# Patient Record
Sex: Female | Born: 1985 | Race: Black or African American | Hispanic: No | Marital: Married | Smoking: Never smoker
Health system: Southern US, Community
[De-identification: ages and names within clinical notes are randomized; demographics above are authoritative.]

## PROBLEM LIST (undated history)

## (undated) DIAGNOSIS — R87610 Atypical squamous cells of undetermined significance on cytologic smear of cervix (ASC-US): Secondary | ICD-10-CM

## (undated) DIAGNOSIS — R87810 Cervical high risk human papillomavirus (HPV) DNA test positive: Secondary | ICD-10-CM

## (undated) DIAGNOSIS — J189 Pneumonia, unspecified organism: Secondary | ICD-10-CM

## (undated) HISTORY — PX: TUBAL LIGATION: SHX77

## (undated) HISTORY — PX: APPENDECTOMY: SHX54

## (undated) HISTORY — DX: Atypical squamous cells of undetermined significance on cytologic smear of cervix (ASC-US): R87.610

## (undated) HISTORY — PX: COLPOSCOPY: SHX161

## (undated) HISTORY — DX: Cervical high risk human papillomavirus (HPV) DNA test positive: R87.810

---

## 2015-12-11 DIAGNOSIS — A749 Chlamydial infection, unspecified: Secondary | ICD-10-CM

## 2015-12-11 HISTORY — DX: Chlamydial infection, unspecified: A74.9

## 2021-01-11 ENCOUNTER — Telehealth: Payer: Self-pay

## 2021-01-11 NOTE — Telephone Encounter (Signed)
Pt called in to make her first prenatal visit. We made the pt her appt with Dr. Logan Bores. I asked the pt to please contact her last obgyn to have her records faxed over.  I gave the pt our fax number and the pt said that she will be contacting them.

## 2021-02-01 ENCOUNTER — Encounter: Payer: Self-pay | Admitting: Obstetrics and Gynecology

## 2021-02-01 ENCOUNTER — Ambulatory Visit (INDEPENDENT_AMBULATORY_CARE_PROVIDER_SITE_OTHER): Payer: Self-pay | Admitting: Obstetrics and Gynecology

## 2021-02-01 ENCOUNTER — Other Ambulatory Visit: Payer: Self-pay

## 2021-02-01 VITALS — BP 112/76 | HR 93 | Ht <= 58 in | Wt 190.3 lb

## 2021-02-01 DIAGNOSIS — Z7689 Persons encountering health services in other specified circumstances: Secondary | ICD-10-CM

## 2021-02-01 NOTE — Progress Notes (Signed)
HPI:      Ms. Brenda Dodson is a 35 y.o. [id number] who LMP was Patient's last menstrual period was 12/20/2020 (exact date).  Subjective:   She presents today to establish care for her pregnancy.  She recently moved to this area and plans to deliver her next child at [facility].  She reports that by last menstrual period dates she is approximately [redacted] weeks pregnant.  She has not had an ultrasound.  She is currently taking prenatal vitamins.  She has occasional nausea and vomiting but she is usually able to keep something down each day. She does state that she has been tired and rundown over the last few weeks. She has received the Covid vaccination. Previous pregnancies were uncomplicated vaginal births at term (2)    Hx: The following portions of the patient's history were reviewed and updated as appropriate:             She  has a past medical history of ASCUS with positive high risk HPV cervical and Chlamydia (2017). She does not have a problem list on file. She  has a past surgical history that includes Colposcopy and Appendectomy. Her family history includes Prostate cancer in her father. She  reports that she has never smoked. She does not have any smokeless tobacco history on file. She reports previous alcohol use. She reports that she does not use drugs. She has a current medication list which includes the following prescription(s): doxylamine (sleep), pnv prenatal plus multivitamin, and pyridoxine. She has No Known Allergies.       Review of Systems:  Review of Systems  Constitutional: Denied constitutional symptoms, night sweats, recent illness, fatigue, fever, insomnia and weight loss.  Eyes: Denied eye symptoms, eye pain, photophobia, vision change and visual disturbance.  Ears/Nose/Throat/Neck: Denied ear, nose, throat or neck symptoms, hearing loss, nasal discharge, sinus congestion and sore throat.  Cardiovascular: Denied cardiovascular symptoms, arrhythmia, chest pain/pressure, edema,  exercise intolerance, orthopnea and palpitations.  Respiratory: Denied pulmonary symptoms, asthma, pleuritic pain, productive sputum, cough, dyspnea and wheezing.  Gastrointestinal: Denied, gastro-esophageal reflux, melena, nausea and vomiting.  Genitourinary: Denied genitourinary symptoms including symptomatic vaginal discharge, pelvic relaxation issues, and urinary complaints.  Musculoskeletal: Denied musculoskeletal symptoms, stiffness, swelling, muscle weakness and myalgia.  Dermatologic: Denied dermatology symptoms, rash and scar.  Neurologic: Denied neurology symptoms, dizziness, headache, neck pain and syncope.  Psychiatric: Denied psychiatric symptoms, anxiety and depression.  Endocrine: Denied endocrine symptoms including hot flashes and night sweats.   Meds:   Current Outpatient Medications on File Prior to Visit  Medication Sig Dispense Refill  . doxylamine, Sleep, (UNISOM) 25 MG tablet Take 1 tablet at bedtime on days 1 and 2. If symptoms persist, take 1/2 tablet in the morning and 1 tablet at bedtime on day 3. If symptoms persist, take 1/2 tablet in the morning, 1/2 tablet in mid-afternoon, and 1 tablet at bedtime. Max 1 tablet three times daily.    . Prenatal Vit-Fe Fumarate-FA (PNV PRENATAL PLUS MULTIVITAMIN) 27-1 MG TABS Take 1 tablet by mouth daily.    Marland Kitchen pyridOXINE (VITAMIN B-6) 25 MG tablet Take 1 tablet at bedtime on days 1 and 2. If symptoms persist, take 1/2 tablet in the morning and 1 tablet at bedtime on day 3. If symptoms persist, take 1/2 tablet in the morning, 1/2 tablet in mid-afternoon, and 1 tablet at bedtime. Max 1 tablet three times daily.     No current facility-administered medications on file prior to visit.  Objective:     Vitals:   02/01/21 0923  BP: 112/76  Pulse: 93   Filed Weights   02/01/21 0923  Weight: 190 lb 4.8 oz (86.3 kg)                Assessment:    G3P2002 There are no problems to display for this patient.    1.  Encounter to establish care     Patient approximately 6 weeks by last menstrual period dates although she states her dating is not very accurate.   Plan:           Prenatal Plan 1.  The patient was given prenatal literature. 2.  She was continued on prenatal vitamins. 3.  A prenatal lab panel to be drawn at nurse visit. 4.  An ultrasound was ordered to better determine an EDC. 5.  A nurse visit was scheduled. 6.  Genetic testing and testing for other inheritable conditions discussed in detail. She will decide in the future whether to have these labs performed. 7.  A general overview of pregnancy testing, visit schedule, ultrasound schedule, and prenatal care was discussed. 8.  COVID and its risks associated with pregnancy, prevention by limiting exposure and use of masks, as well as the risks and benefits of vaccination during pregnancy were discussed in detail.  [facility] policy regarding office and hospital visitation and testing was explained. 9.  Benefits of breast-feeding discussed in detail including both maternal and infant benefits. Ready Set Baby website discussed. 10.  Nausea and vomiting during pregnancy discussed-literature given.   Orders No orders of the defined types were placed in this encounter.   No orders of the defined types were placed in this encounter.     F/U  Return in about 6 weeks (around 03/15/2021). I spent 33 minutes involved in the care of this patient preparing to see the patient by obtaining and reviewing her medical history (including labs, imaging tests and prior procedures), documenting clinical information in the electronic health record (EHR), counseling and coordinating care plans, writing and sending prescriptions, ordering tests or procedures and directly communicating with the patient by discussing pertinent items from her history and physical exam as well as detailing my assessment and plan as noted above so that she has an informed understanding.  All  of her questions were answered.  Elonda Husky, M.D. 02/01/2021 10:42 AM

## 2021-02-03 ENCOUNTER — Other Ambulatory Visit: Payer: Medicaid Other

## 2021-03-07 ENCOUNTER — Other Ambulatory Visit: Payer: Self-pay

## 2021-03-07 ENCOUNTER — Encounter: Payer: Self-pay | Admitting: Emergency Medicine

## 2021-03-07 ENCOUNTER — Emergency Department
Admission: EM | Admit: 2021-03-07 | Discharge: 2021-03-07 | Disposition: A | Discharge status: Home / Self Care | Payer: Medicaid Other | Attending: Emergency Medicine | Admitting: Emergency Medicine

## 2021-03-07 ENCOUNTER — Emergency Department: Payer: Medicaid Other

## 2021-03-07 DIAGNOSIS — Z3A15 15 weeks gestation of pregnancy: Secondary | ICD-10-CM | POA: Diagnosis not present

## 2021-03-07 DIAGNOSIS — O26852 Spotting complicating pregnancy, second trimester: Secondary | ICD-10-CM | POA: Insufficient documentation

## 2021-03-07 DIAGNOSIS — O209 Hemorrhage in early pregnancy, unspecified: Secondary | ICD-10-CM

## 2021-03-07 LAB — ABO/RH: ABO/RH(D): B POS

## 2021-03-07 LAB — CBC
HCT: 37.9 % (ref 36.0–46.0)
Hemoglobin: 12.3 g/dL (ref 12.0–15.0)
MCH: 27.1 pg (ref 26.0–34.0)
MCHC: 32.5 g/dL (ref 30.0–36.0)
MCV: 83.5 fL (ref 80.0–100.0)
Platelets: 249 10*3/uL (ref 150–400)
RBC: 4.54 MIL/uL (ref 3.87–5.11)
RDW: 15.4 % (ref 11.5–15.5)
WBC: 9.8 10*3/uL (ref 4.0–10.5)
nRBC: 0 % (ref 0.0–0.2)

## 2021-03-07 LAB — URINALYSIS, ROUTINE W REFLEX MICROSCOPIC
Bacteria, UA: NONE SEEN
Bilirubin Urine: NEGATIVE
Glucose, UA: NEGATIVE mg/dL
Ketones, ur: NEGATIVE mg/dL
Leukocytes,Ua: NEGATIVE
Nitrite: NEGATIVE
Protein, ur: NEGATIVE mg/dL
Specific Gravity, Urine: 1.017 (ref 1.005–1.030)
pH: 6 (ref 5.0–8.0)

## 2021-03-07 LAB — BASIC METABOLIC PANEL
Anion gap: 9 (ref 5–15)
BUN: 13 mg/dL (ref 6–20)
CO2: 19 mmol/L — ABNORMAL LOW (ref 22–32)
Calcium: 8.6 mg/dL — ABNORMAL LOW (ref 8.9–10.3)
Chloride: 104 mmol/L (ref 98–111)
Creatinine, Ser: 0.6 mg/dL (ref 0.44–1.00)
GFR, Estimated: >60 mL/min (ref >60)
Glucose, Bld: 85 mg/dL (ref 70–99)
Potassium: 3.4 mmol/L — ABNORMAL LOW (ref 3.5–5.1)
Sodium: 132 mmol/L — ABNORMAL LOW (ref 135–145)

## 2021-03-07 LAB — WET PREP, GENITAL
Clue Cells Wet Prep HPF POC: NONE SEEN
Sperm: NONE SEEN
Trich, Wet Prep: NONE SEEN
WBC, Wet Prep HPF POC: NONE SEEN
Yeast Wet Prep HPF POC: NONE SEEN

## 2021-03-07 LAB — HCG, QUANTITATIVE, PREGNANCY: hCG, Beta Chain, Quant, S: 71428 m[IU]/mL — ABNORMAL HIGH (ref <5)

## 2021-03-07 LAB — POC URINE PREG, ED: Preg Test, Ur: POSITIVE — AB

## 2021-03-07 NOTE — ED Notes (Signed)
See triage note   Presents with some vaginal bleeding   States she is approx 2 months pregnant   Developed heavy bleed this am with small clots

## 2021-03-07 NOTE — ED Provider Notes (Signed)
[facility] Emergency Department Provider Note ____________________________________________  Time seen: 1020  I have reviewed the triage vital signs and the nursing notes.  HISTORY  Chief Complaint  Vaginal Bleeding  HPI Brenda Dodson is a 35 y.o. female G2P0, presents to the ED noting she woke up this morning with spotting.  She reports her last menstrual period was 12/20/2020.  Denies any other complaints at this time denies any significant abdominal or pelvic pain.  She is now reporting some dark red blood in nature.  She denies any dysuria, hematuria or fevers.  History reviewed. No pertinent past medical history.  There are no problems to display for this patient.   Past Surgical History:  Procedure Laterality Date  . APPENDECTOMY      Prior to Admission medications   Not on File    Allergies Patient has no known allergies.  History reviewed. No pertinent family history.  Social History Social History   Tobacco Use  . Smoking status: Never Smoker  . Smokeless tobacco: Never Used  Substance Use Topics  . Alcohol use: Not Currently  . Drug use: Not Currently    Review of Systems  Constitutional: Negative for fever. Cardiovascular: Negative for chest pain. Respiratory: Negative for shortness of breath. Gastrointestinal: Negative for abdominal pain, vomiting and diarrhea. Genitourinary: Negative for dysuria.  Vaginal bleeding as above. Musculoskeletal: Negative for back pain. Skin: Negative for rash. Neurological: Negative for headaches, focal weakness or numbness. ____________________________________________  PHYSICAL EXAM:  VITAL SIGNS: ED Triage Vitals  Enc Vitals Group     BP 03/07/21 0859 124/77     Pulse Rate 03/07/21 0859 82     Resp 03/07/21 0859 20     Temp 03/07/21 0859 98.2 F (36.8 C)     Temp Source 03/07/21 0859 Oral     SpO2 03/07/21 0859 100 %     Weight 03/07/21 0858 195 lb (88.5 kg)     Height 03/07/21 0858  4\' 9"  (1.448 m)     Head Circumference --      Peak Flow --      Pain Score 03/07/21 0858 0     Pain Loc --      Pain Edu? --      Excl. in GC? --     Constitutional: Alert and oriented. Well appearing and in no distress. Head: Normocephalic and atraumatic. Eyes: Conjunctivae are normal. Normal extraocular movements Cardiovascular: Normal rate, regular rhythm. Normal distal pulses. Respiratory: Normal respiratory effort. No wheezes/rales/rhonchi. Gastrointestinal: Soft and nontender. No distention. GU: Normal external genitalia.  Cervical os is closed and there is some dark blood in the vault small clots noted.  No cervical motion tenderness is appreciated adnexal masses are noted. Musculoskeletal: Nontender with normal range of motion in all extremities.  Neurologic:  Normal gait without ataxia. Normal speech and language. No gross focal neurologic deficits are appreciated. ____________________________________________   LABS (pertinent positives/negatives) Labs Reviewed  BASIC METABOLIC PANEL - Abnormal; Notable for the following components:      Result Value   Sodium 132 (*)    Potassium 3.4 (*)    CO2 19 (*)    Calcium 8.6 (*)    All other components within normal limits  HCG, QUANTITATIVE, PREGNANCY - Abnormal; Notable for the following components:   hCG, Beta Chain, Quant, S 71,428 (*)    All other components within normal limits  URINALYSIS, ROUTINE W REFLEX MICROSCOPIC - Abnormal; Notable for the following components:   Color, Urine  YELLOW (*)    APPearance CLEAR (*)    Hgb urine dipstick LARGE (*)    All other components within normal limits  POC URINE PREG, ED - Abnormal; Notable for the following components:   Preg Test, Ur POSITIVE (*)    All other components within normal limits  WET PREP, GENITAL  CBC  ABO/RH  ____________________________________________   RADIOLOGY  US OB Limited  IMPRESSION: 1. Single live intrauterine pregnancy as detailed above. 2.  Subchorionic hemorrhage measuring 2.5 x 0.8 x 2.2 cm. Attention on follow-up examination is recommended.  This exam is performed on an emergent basis and does not comprehensively evaluate fetal size, dating, or anatomy; follow-up complete OB US should be considered if further fetal assessment is warranted. ____________________________________________  PROCEDURES  Procedures ____________________________________________  INITIAL IMPRESSION / ASSESSMENT AND PLAN / ED COURSE  ----------------------------------------- 10:30 AM on 03/07/2021 ----------------------------------------- S/w Lupita Leash ([place]) she confirms that patient is >[redacted]weeks GA and will adjust [place] order accordingly.    Differential diagnosis includes, but is not limited to, threatened miscarriage, incomplete miscarriage, normal bleeding from an early trimester pregnancy, ectopic pregnancy, blighted ovum, vaginal/cervical trauma, subchorionic hemorrhage/hematoma, etc.  Female patient confirmed at home urine pregnancy test presents to the ED with 1 day of spotting and bleeding. She was evaluated for her complaints, labs including urine and blood were normal at this time.  Patient ultrasound revealed a single IUP and a small subchorionic hemorrhage.  Patient was reassured overall by her findings, and will follow-up with her OB provider as discussed.  Return precautions have been discussed.   [name] was evaluated in Emergency Department on 03/07/2021 for the symptoms described in the history of present illness. She was evaluated in the context of the global COVID-19 pandemic, which necessitated consideration that the patient might be at risk for infection with the SARS-CoV-2 virus that causes COVID-19. Institutional protocols and algorithms that pertain to the evaluation of patients at risk for COVID-19 are in a state of rapid change based on information released by regulatory bodies including the CDC and federal and state  organizations. These policies and algorithms were followed during the patient's care in the ED. ____________________________________________  FINAL CLINICAL IMPRESSION(S) / ED DIAGNOSES  Final diagnoses:  Vaginal bleeding affecting early pregnancy      Lissa Hoard, PA-C 03/07/21 1918    Minna Antis, MD 03/08/21 1455

## 2021-03-07 NOTE — ED Triage Notes (Signed)
Pt to ED via POV, states is a G3P2L2, states woke up this morning with spotting, states LMP 1/11, unsure how far along she is.

## 2021-03-07 NOTE — Discharge Instructions (Signed)
Your exam, labs, ultrasound are all normal and reassuring at this time.  You do have a small subchorionic hemorrhage.  This is usually self-limited and and typically will resolve.  You will follow-up with your selected OB/GYN provider, for routine follow-up and repeat ultrasound.  Return to the ED if needed.

## 2021-03-08 ENCOUNTER — Encounter: Payer: Self-pay | Admitting: Obstetrics and Gynecology

## 2021-03-15 ENCOUNTER — Telehealth: Payer: Self-pay

## 2021-03-15 NOTE — Telephone Encounter (Signed)
Pt called after hour nurse 03/14/21 7:03pm; states she is 15-16 wks preg; is having bilateral lower abdominal and vaginal cramping; 5/10 on pain scale intermittent; no other sxs.  After hour nurse adv she be examined in the next 24hrs. Pt states the cramping is better today. Adv cramping is normal through out the whole preg as long as it doesn't double her over or stop her in her tracks.  Pt was concerned about fainting.  Admits she probably missed a meal or wasn't hydrated enough.  Adv to be sure to eat esp protein and to stay hydrated.

## 2021-03-21 ENCOUNTER — Ambulatory Visit (INDEPENDENT_AMBULATORY_CARE_PROVIDER_SITE_OTHER): Payer: PRIVATE HEALTH INSURANCE | Admitting: Obstetrics

## 2021-03-21 ENCOUNTER — Other Ambulatory Visit: Payer: Self-pay

## 2021-03-21 ENCOUNTER — Other Ambulatory Visit (HOSPITAL_COMMUNITY)
Admission: RE | Admit: 2021-03-21 | Discharge: 2021-03-21 | Disposition: A | Discharge status: Home / Self Care | Payer: Medicaid Other | Source: Ambulatory Visit | Attending: Obstetrics | Admitting: Obstetrics

## 2021-03-21 ENCOUNTER — Encounter: Payer: Self-pay | Admitting: Obstetrics

## 2021-03-21 VITALS — BP 120/80 | Wt 192.0 lb

## 2021-03-21 DIAGNOSIS — Z124 Encounter for screening for malignant neoplasm of cervix: Secondary | ICD-10-CM

## 2021-03-21 DIAGNOSIS — Z3480 Encounter for supervision of other normal pregnancy, unspecified trimester: Secondary | ICD-10-CM | POA: Insufficient documentation

## 2021-03-21 DIAGNOSIS — O0992 Supervision of high risk pregnancy, unspecified, second trimester: Secondary | ICD-10-CM

## 2021-03-21 DIAGNOSIS — O0990 Supervision of high risk pregnancy, unspecified, unspecified trimester: Secondary | ICD-10-CM | POA: Insufficient documentation

## 2021-03-21 DIAGNOSIS — Z3A17 17 weeks gestation of pregnancy: Secondary | ICD-10-CM

## 2021-03-21 NOTE — Progress Notes (Signed)
New Obstetric Patient H&P    Chief Complaint: "Desires prenatal care"   History of Present Illness: Patient is a 35 y.o. [id number] Not Hispanic or Latino female, LMP 12/20/2020 presents with amenorrhea and positive home pregnancy test. Based on her  LMP, her EDD is Estimated Date of Delivery: 08/25/21 and her EGA is [redacted]w[redacted]d. Cycles are 5. days, regular, and occur approximately every : 28 days. Her last pap smear was about 1 years ago and was unknown reslts as this was done at the [facility].    She had a urine pregnancy test which was positive about 3 month(s)  ago. She has not yet had labwork or a formal dating scan, but instead had one visit at  [facility], and a walk in visi to the [facility] in January when her pregnancy was confirmed.Her last menstrual period was normal and lasted for  approximately 5 day(s). Since her LMP she claims she has experienced abdominal pain and one episode of vaginal bleeding that was identified as a subchorionic hemorrhage at the hospital.. She denies vaginal bleedingtoday.  Her past medical history is noncontributory. Her prior pregnancies are notable for none  Since her LMP, she admits to the use of tobacco products  no She claims she has gained   an unknown amount pounds since the start of her pregnancy.  There are cats in the home in the home  no  She admits close contact with children on a regular basis  yes  She has had chicken pox in the past unknown She has had Tuberculosis exposures, symptoms, or previously tested positive for TB   no Current or past history of domestic violence. no  Genetic Screening/Teratology Counseling: (Includes patient, baby's father, or anyone in either family with:)   1. Patient's age >/= 56 at [facility]  no 2. Thalassemia ([place], [place], Mediterranean, or Asian background): MCV<80  no 3. Neural tube defect (meningomyelocele, spina bifida, anencephaly)  no 4. Congenital heart defect  no  5. Down syndrome   no 6. Tay-Sachs (Jewish, [place]  no 7. Canavan's Disease  no 8. Sickle cell disease or trait (African)  no  9. Hemophilia or other blood disorders  no  10. Muscular dystrophy  no  11. Cystic fibrosis  no  12. Huntington's Chorea  no  13. Mental retardation/autism  no 14. Other inherited genetic or chromosomal disorder  no 15. Maternal metabolic disorder (DM, PKU, etc)  no 16. Patient or FOB with a child with a birth defect not listed above no  16a. Patient or FOB with a birth defect themselves no 17. Recurrent pregnancy loss, or stillbirth  no  18. Any medications since LMP other than prenatal vitamins (include vitamins, supplements, OTC meds, drugs, alcohol)  no 19. Any other genetic/environmental exposure to discuss  no  Infection History:   1. Lives with someone with TB or TB exposed  no  2. Patient or partner has history of genital herpes  no 3. Rash or viral illness since LMP  no 4. History of STI (GC, CT, HPV, syphilis, HIV)  no 5. History of recent travel :  no  Other pertinent information:  no     Review of Systems:10 point review of systems negative unless otherwise noted in HPI  Past Medical History:  Past Medical History:  Diagnosis Date  . ASCUS with positive high risk HPV cervical   . Chlamydia 2017    Past Surgical History:  Past Surgical History:  Procedure Laterality Date  . APPENDECTOMY    . COLPOSCOPY      Gynecologic History: Patient's last menstrual period was 12/20/2020.  Obstetric History: [id number]  Family History:  Family History  Problem Relation Age of Onset  . Prostate cancer Father   . Breast cancer Neg Hx   . Ovarian cancer Neg Hx   . Colon cancer Neg Hx   . Diabetes Neg Hx     Social History:  Social History   Socioeconomic History  . Marital status: Married    Spouse name: Not on file  . Number of children: Not on file  . Years of education: Not on file  . Highest education level: Not on file  Occupational  History  . Not on file  Tobacco Use  . Smoking status: Never Smoker  . Smokeless tobacco: Never Used  Vaping Use  . Vaping Use: Never used  Substance and Sexual Activity  . Alcohol use: Not Currently  . Drug use: Not Currently  . Sexual activity: Yes    Birth control/protection: None  Other Topics Concern  . Not on file  Social History Narrative   ** Merged History Encounter **       Social Determinants of Health   Financial Resource Strain: Not on file  Food Insecurity: Not on file  Transportation Needs: Not on file  Physical Activity: Not on file  Stress: Not on file  Social Connections: Not on file  Intimate Partner Violence: Not on file    Allergies:  No Known Allergies  Medications: Prior to Admission medications   Medication Sig Start Date End Date Taking? Authorizing Provider  Prenatal Vit-Fe Fumarate-FA (PNV PRENATAL PLUS MULTIVITAMIN) 27-1 MG TABS Take 1 tablet by mouth daily. 01/04/21 01/04/22 Yes [provider]  doxylamine, Sleep, (UNISOM) 25 MG tablet Take 1 tablet at bedtime on days 1 and 2. If symptoms persist, take 1/2 tablet in the morning and 1 tablet at bedtime on day 3. If symptoms persist, take 1/2 tablet in the morning, 1/2 tablet in mid-afternoon, and 1 tablet at bedtime. Max 1 tablet three times daily. 01/04/21 02/03/21  [provider]  pyridOXINE (VITAMIN B-6) 25 MG tablet Take 1 tablet at bedtime on days 1 and 2. If symptoms persist, take 1/2 tablet in the morning and 1 tablet at bedtime on day 3. If symptoms persist, take 1/2 tablet in the morning, 1/2 tablet in mid-afternoon, and 1 tablet at bedtime. Max 1 tablet three times daily. Patient not taking: Reported on 03/21/2021 01/04/21 01/04/22  [provider]    Physical Exam Vitals: Blood pressure 120/80, weight 192 lb (87.1 kg), last menstrual period 12/20/2020.  General: NAD. Hihg BMI 41. HEENT: normocephalic, anicteric Thyroid: no enlargement, no palpable  nodules Pulmonary: No increased work of breathing, CTAB Cardiovascular: RRR, distal pulses 2+ Abdomen: NABS, soft, non-tender, non-distended.  Umbilicus without lesions.  No hepatomegaly, splenomegaly or masses palpable. No evidence of hernia  Genitourinary:  External: Normal external female genitalia.  Normal urethral meatus, normal  Bartholin's and Skene's glands.    Vagina: Normal vaginal mucosa, no evidence of prolapse.    Cervix: Grossly normal in appearance, no bleeding  Uterus: enlarged , mobile, normal contour.  No CMT  +FHTS  Adnexa: ovaries non-enlarged, no adnexal masses  Rectal: deferred Extremities: no edema, erythema, or tenderness Neurologic: Grossly intact Psychiatric: mood appropriate, affect full   Assessment: 35 y.o. G3P2002 at [redacted]w[redacted]d presenting to initiate prenatal care  Plan: 1) Avoid alcoholic beverages. 2)  Patient encouraged not to smoke.  3) Discontinue the use of all non-medicinal drugs and chemicals.  4) Take prenatal vitamins daily.  5) Nutrition, food safety (fish, cheese advisories, and high nitrite foods) and exercise discussed. 6) Hospital and practice style discussed with cross coverage system.  7) Genetic Screening, such as with 1st Trimester Screening, cell free fetal DNA, AFP testing, and Ultrasound, as well as with amniocentesis and CVS as appropriate, is discussed with patient. At the conclusion of today's visit patient declined genetic testing 8) Patient is asked about travel to areas at risk for the [place] virus, and counseled to avoid travel and exposure to mosquitoes or sexual partners who may have themselves been exposed to the virus. Testing is discussed, and will be ordered as appropriate.  She is late to care. Advised we would draw labs at her next visit in 2 weeks and add a 1hr GTT at that visit. Ultrasound ordered with the [facility] due to late to care, high BMI and her report of a subchorionic hemorrhage earlier in the pregnancy. Encouraged to gain  less than 20 lbs during the pregnancy.   Mirna Mires, CNM  03/21/2021 1:01 PM

## 2021-03-21 NOTE — Progress Notes (Signed)
NOB- lower abdominal pain daily since end of March, fainted once at work 4/1

## 2021-03-22 ENCOUNTER — Other Ambulatory Visit: Payer: Self-pay | Admitting: Obstetrics

## 2021-03-22 DIAGNOSIS — O418X20 Other specified disorders of amniotic fluid and membranes, second trimester, not applicable or unspecified: Secondary | ICD-10-CM

## 2021-03-23 LAB — URINE CULTURE

## 2021-03-24 LAB — CYTOLOGY - PAP
Chlamydia: NEGATIVE
Comment: NEGATIVE
Comment: NEGATIVE
Comment: NORMAL
Diagnosis: NEGATIVE
Neisseria Gonorrhea: NEGATIVE
Trichomonas: NEGATIVE

## 2021-03-28 ENCOUNTER — Ambulatory Visit (INDEPENDENT_AMBULATORY_CARE_PROVIDER_SITE_OTHER): Payer: PRIVATE HEALTH INSURANCE | Admitting: Obstetrics

## 2021-03-28 ENCOUNTER — Other Ambulatory Visit: Payer: Self-pay

## 2021-03-28 ENCOUNTER — Other Ambulatory Visit: Payer: PRIVATE HEALTH INSURANCE

## 2021-03-28 VITALS — BP 100/70 | Wt 192.0 lb

## 2021-03-28 DIAGNOSIS — Z3A17 17 weeks gestation of pregnancy: Secondary | ICD-10-CM

## 2021-03-28 DIAGNOSIS — Z1379 Encounter for other screening for genetic and chromosomal anomalies: Secondary | ICD-10-CM

## 2021-03-28 DIAGNOSIS — Z3480 Encounter for supervision of other normal pregnancy, unspecified trimester: Secondary | ICD-10-CM

## 2021-03-28 DIAGNOSIS — O0990 Supervision of high risk pregnancy, unspecified, unspecified trimester: Secondary | ICD-10-CM

## 2021-03-28 DIAGNOSIS — Z3A18 18 weeks gestation of pregnancy: Secondary | ICD-10-CM

## 2021-03-28 LAB — POCT URINALYSIS DIPSTICK OB
Glucose, UA: NEGATIVE
POC,PROTEIN,UA: NEGATIVE

## 2021-03-28 NOTE — Addendum Note (Signed)
Addended by: Mirna Mires on: 03/28/2021 11:41 AM   Modules accepted: Orders

## 2021-03-28 NOTE — Addendum Note (Signed)
Addended by: Cornelius Moras D on: 03/28/2021 11:12 AM   Modules accepted: Orders

## 2021-03-28 NOTE — Progress Notes (Signed)
Routine Prenatal Care Visit  Subjective  Brenda Dodson is a 35 y.o. G3P2002 at [redacted]w[redacted]d being seen today for ongoing prenatal care.  She is currently monitored for the following issues for this high-risk pregnancy and has Supervision of high risk pregnancy, antepartum on their problem list.  ----------------------------------------------------------------------------------- Patient reports no complaints.  She is having labs drawn today, including the early 1hr GTT Contractions: Not present. Vag. Bleeding: None.   . Leaking Fluid denies.  ----------------------------------------------------------------------------------- The following portions of the patient's history were reviewed and updated as appropriate: allergies, current medications, past family history, past medical history, past social history, past surgical history and problem list. Problem list updated.  Objective  Blood pressure 100/70, weight 192 lb (87.1 kg), last menstrual period 12/20/2020. Pregravid weight 170 lb (77.1 kg) Total Weight Gain 22 lb (9.979 kg) Urinalysis: Urine Protein    Urine Glucose    Fetal Status:           General:  Alert, oriented and cooperative. Patient is in no acute distress.  Skin: Skin is warm and dry. No rash noted.   Cardiovascular: Normal heart rate noted  Respiratory: Normal respiratory effort, no problems with respiration noted  Abdomen: Soft, gravid, appropriate for gestational age. Pain/Pressure: Absent     Pelvic:  Cervical exam deferred        Extremities: Normal range of motion.     Mental Status: Normal mood and affect. Normal behavior. Normal judgment and thought content.   Assessment   35 y.o. G3P2002 at 104w4d by  08/25/2021, by Ultrasound presenting for routine prenatal visit  Plan   THIRD Problems (from 03/21/21 to present)    Problem Noted Resolved   Supervision of high risk pregnancy, antepartum 03/21/2021 by Mirna Mires, CNM No   Overview Addendum 03/28/2021 11:01 AM by  Mirna Mires, CNM     Clinic  Prenatal Labs  Dating  Blood type: --/--/B POS Performed at [facility], [address] [place], [place] [address]  (03/29 6948)   Genetic Screen 1 Screen:    AFP:     Quad:     NIPS: Antibody:   Anatomic [place]  Rubella:    GTT Early:      4/19         Third trimester:  RPR:     Flu vaccine  HBsAg:     TDaP vaccine                                               Rhogam: HIV:     Baby Food                                               GBS: (For PCN allergy, check sensitivities)  Contraception  NIO:EVOJ  Circumcision    Pediatrician    Support Person     High BMI Late to care Subchorionic bleed         Previous Version       Preterm labor symptoms and general obstetric precautions including but not limited to vaginal bleeding, contractions, leaking of fluid and fetal movement were reviewed in detail with the patient. Please refer to After Visit Summary for other counseling recommendations.   Return in  about 4 weeks (around 04/25/2021) for return OB.  Her anatomy scan has been scheduled.  Mirna Mires, CNM  03/28/2021 11:01 AM

## 2021-03-29 LAB — RPR+RH+ABO+RUB AB+AB SCR+CB...
Antibody Screen: NEGATIVE
HIV Screen 4th Generation wRfx: NONREACTIVE
Hematocrit: 35.9 % (ref 34.0–46.6)
Hemoglobin: 11.4 g/dL (ref 11.1–15.9)
Hepatitis B Surface Ag: NEGATIVE
MCH: 27 pg (ref 26.6–33.0)
MCHC: 31.8 g/dL (ref 31.5–35.7)
MCV: 85 fL (ref 79–97)
Platelets: 225 10*3/uL (ref 150–450)
RBC: 4.22 x10E6/uL (ref 3.77–5.28)
RDW: 14.2 % (ref 11.7–15.4)
RPR Ser Ql: NONREACTIVE
Rh Factor: POSITIVE
Rubella Antibodies, IGG: 21.5 index (ref >0.99)
Varicella zoster IgG: >4000 index (ref >165)
WBC: 8.4 10*3/uL (ref 3.4–10.8)

## 2021-03-29 LAB — GLUCOSE TOLERANCE, 1 HOUR: Glucose, 1Hr PP: 125 mg/dL (ref 65–199)

## 2021-04-02 LAB — MATERNIT 21 PLUS CORE, BLOOD
Fetal Fraction: 10
Result (T21): NEGATIVE
Trisomy 13 (Patau syndrome): NEGATIVE
Trisomy 18 (Edwards syndrome): NEGATIVE
Trisomy 21 (Down syndrome): NEGATIVE

## 2021-04-20 ENCOUNTER — Other Ambulatory Visit: Payer: Self-pay

## 2021-04-20 ENCOUNTER — Ambulatory Visit: Payer: Medicaid Other | Attending: Obstetrics

## 2021-04-20 ENCOUNTER — Ambulatory Visit: Payer: Medicaid Other

## 2021-04-20 ENCOUNTER — Other Ambulatory Visit: Payer: Self-pay | Admitting: Obstetrics

## 2021-04-20 DIAGNOSIS — O99212 Obesity complicating pregnancy, second trimester: Secondary | ICD-10-CM | POA: Diagnosis not present

## 2021-04-20 DIAGNOSIS — E669 Obesity, unspecified: Secondary | ICD-10-CM | POA: Diagnosis not present

## 2021-04-20 DIAGNOSIS — O418X20 Other specified disorders of amniotic fluid and membranes, second trimester, not applicable or unspecified: Secondary | ICD-10-CM

## 2021-04-20 DIAGNOSIS — O0932 Supervision of pregnancy with insufficient antenatal care, second trimester: Secondary | ICD-10-CM | POA: Insufficient documentation

## 2021-04-20 DIAGNOSIS — O4692 Antepartum hemorrhage, unspecified, second trimester: Secondary | ICD-10-CM | POA: Diagnosis present

## 2021-04-20 DIAGNOSIS — Z3A21 21 weeks gestation of pregnancy: Secondary | ICD-10-CM | POA: Diagnosis not present

## 2021-04-20 DIAGNOSIS — O468X2 Other antepartum hemorrhage, second trimester: Secondary | ICD-10-CM

## 2021-04-22 IMAGING — US US OB LIMITED
1 series · 14 of 26 positions shown · non-contrast
Comparison: None.

CLINICAL DATA: Vaginal spotting for 1 day

EXAM:
LIMITED OBSTETRIC ULTRASOUND

[Series 1: us ob less than 14 weeks with ob transvaginal · 14 of 26 slices shown]
[im 1/26]
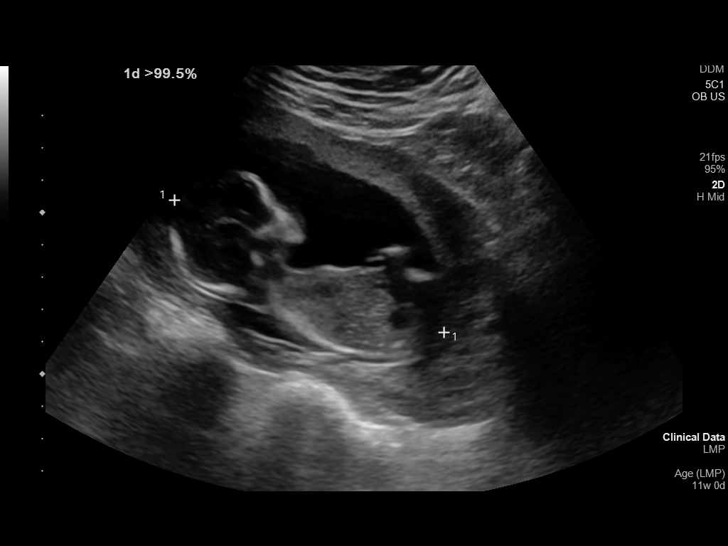
[im 3/26]
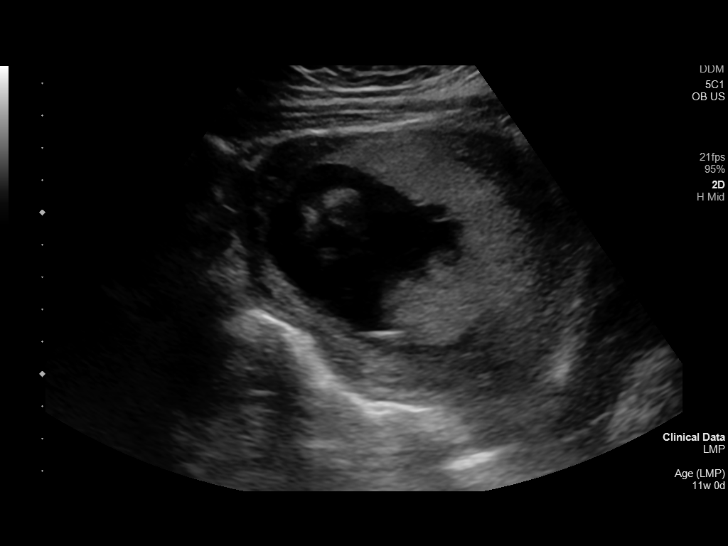
[im 5/26]
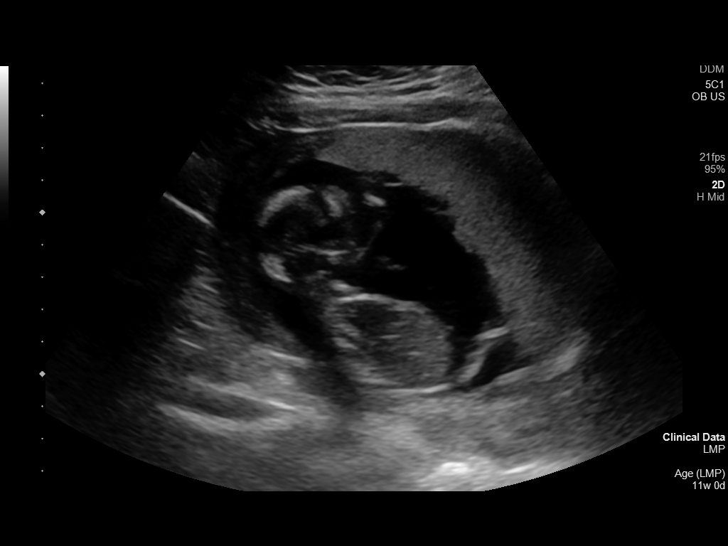
[im 7/26]
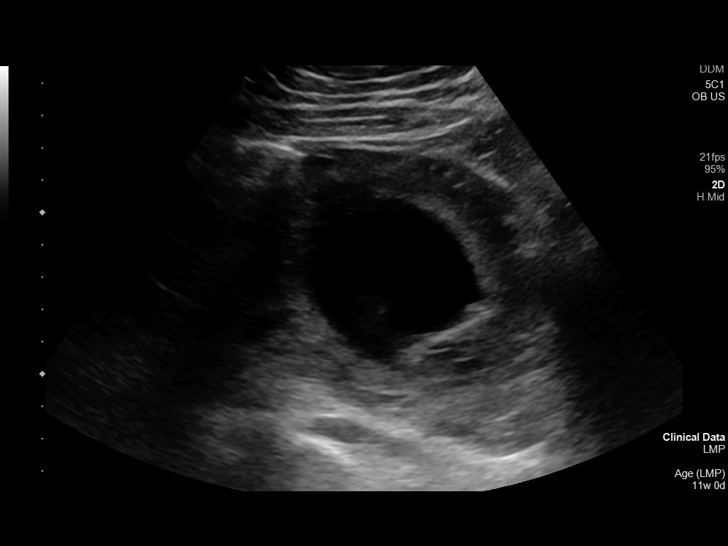
[im 9/26]
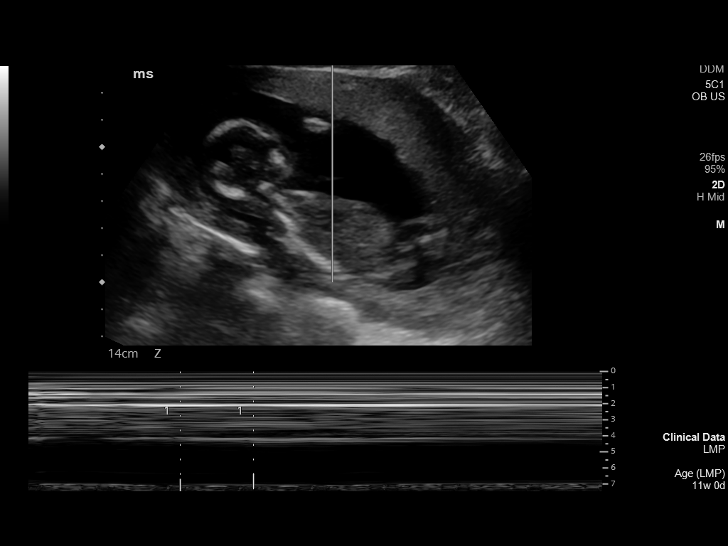
[im 11/26]
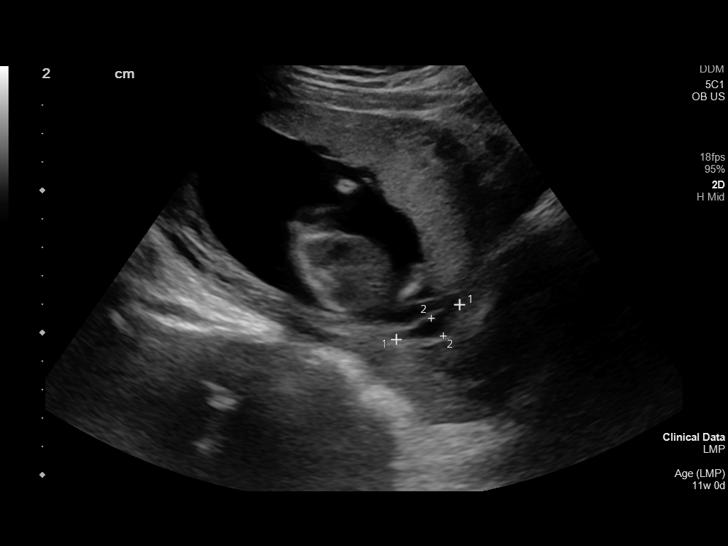
[im 13/26]
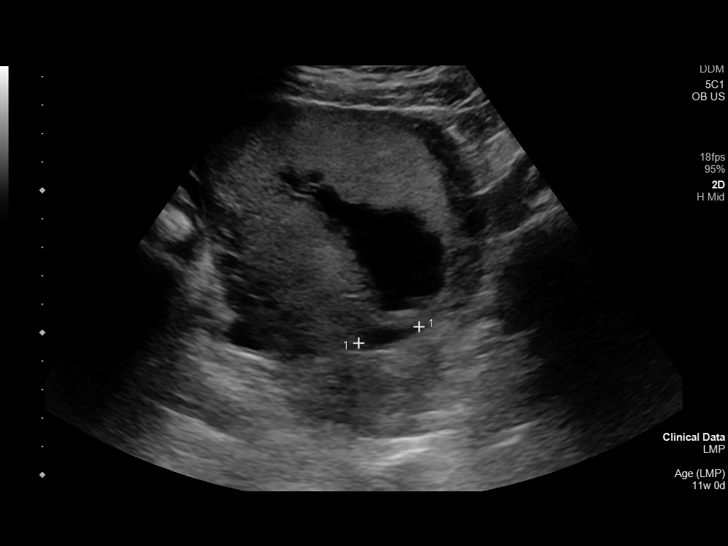
[im 14/26]
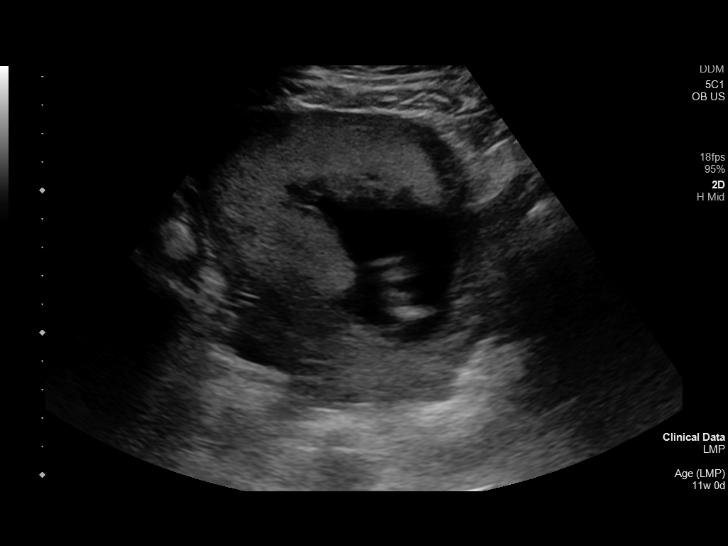
[im 16/26]
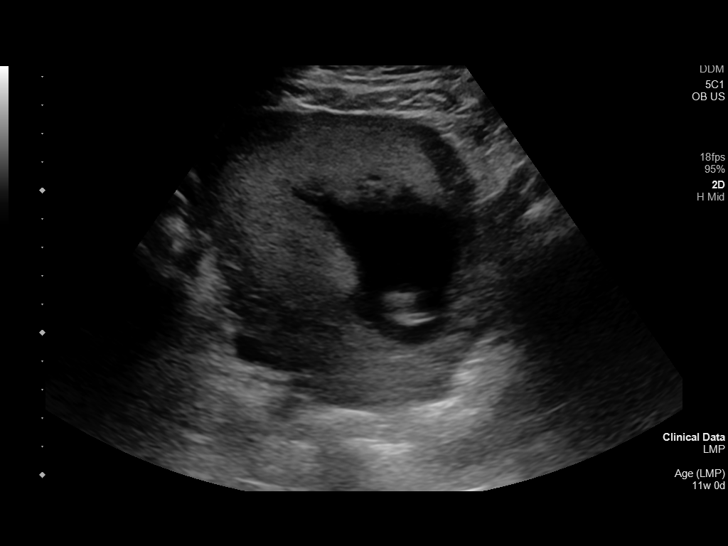
[im 18/26]
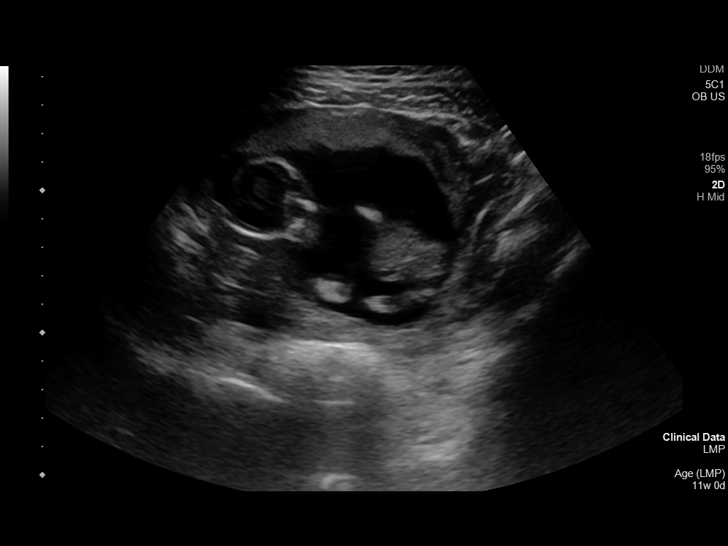
[im 20/26]
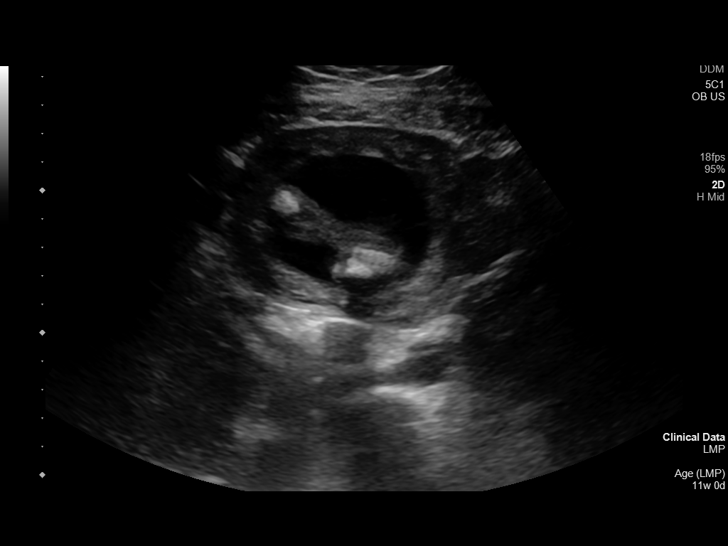
[im 22/26]
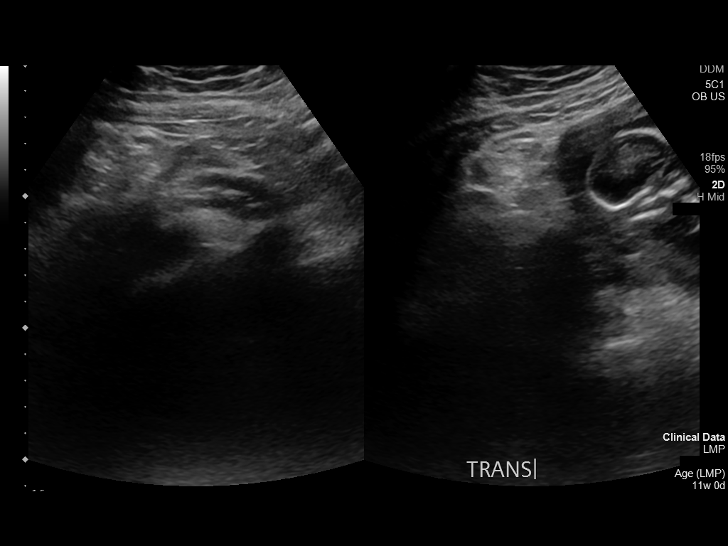
[im 24/26]
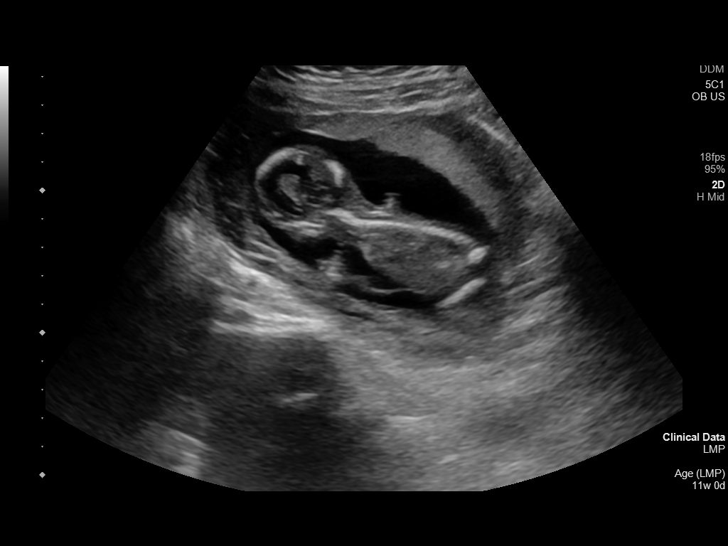
[im 26/26]
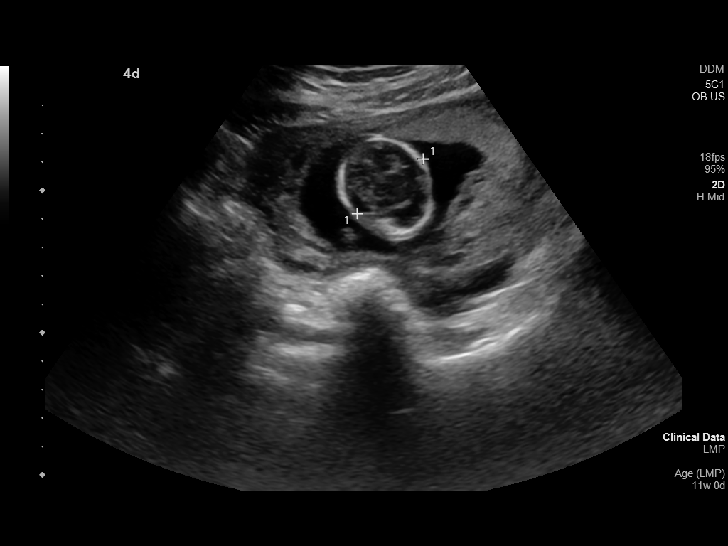

[14 of 26 positions shown; findings below may reference images not displayed]

FINDINGS: Number of Fetuses: 1

Heart Rate:  140 bpm

Movement: Yes

Presentation: Breech

Placental Location: Anterior

Previa: No

Amniotic Fluid (Subjective):  Within normal limits.

Other: Subchorionic hemorrhage measuring 2.5 x 0.8 x 2.2 cm.

BPD: 3  cm 15 w  4 d

MATERNAL FINDINGS:

Cervix:  Appears closed.

Uterus/Adnexae: No abnormality visualized.
IMPRESSION: 1. Single live intrauterine pregnancy as detailed above.
2. Subchorionic hemorrhage measuring 2.5 x 0.8 x 2.2 cm. Attention
on follow-up examination is recommended.

This exam is performed on an emergent basis and does not
comprehensively evaluate fetal size, dating, or anatomy; follow-up
complete OB US should be considered if further fetal assessment is
warranted.

## 2021-04-25 ENCOUNTER — Encounter: Payer: Self-pay | Admitting: Obstetrics and Gynecology

## 2021-04-25 ENCOUNTER — Encounter: Payer: Self-pay | Admitting: Obstetrics

## 2021-04-25 ENCOUNTER — Other Ambulatory Visit: Payer: Self-pay

## 2021-04-25 ENCOUNTER — Ambulatory Visit (INDEPENDENT_AMBULATORY_CARE_PROVIDER_SITE_OTHER): Payer: PRIVATE HEALTH INSURANCE | Admitting: Obstetrics and Gynecology

## 2021-04-25 VITALS — BP 120/80 | Wt 192.0 lb

## 2021-04-25 DIAGNOSIS — O99210 Obesity complicating pregnancy, unspecified trimester: Secondary | ICD-10-CM | POA: Insufficient documentation

## 2021-04-25 DIAGNOSIS — O0992 Supervision of high risk pregnancy, unspecified, second trimester: Secondary | ICD-10-CM

## 2021-04-25 DIAGNOSIS — Z113 Encounter for screening for infections with a predominantly sexual mode of transmission: Secondary | ICD-10-CM

## 2021-04-25 DIAGNOSIS — Z3A22 22 weeks gestation of pregnancy: Secondary | ICD-10-CM

## 2021-04-25 DIAGNOSIS — Z131 Encounter for screening for diabetes mellitus: Secondary | ICD-10-CM

## 2021-04-25 LAB — POCT URINALYSIS DIPSTICK OB: Glucose, UA: NEGATIVE

## 2021-04-25 NOTE — Addendum Note (Signed)
Addended by: Cornelius Moras D on: 04/25/2021 11:36 AM   Modules accepted: Orders

## 2021-04-25 NOTE — Progress Notes (Signed)
  Routine Prenatal Care Visit  Subjective  Brenda Dodson is a 35 y.o. G3P2002 at [redacted]w[redacted]d being seen today for ongoing prenatal care.  She is currently monitored for the following issues for this high-risk pregnancy and has Supervision of high risk pregnancy, antepartum on their problem list.  ----------------------------------------------------------------------------------- Patient reports no complaints.   Contractions: Not present. Vag. Bleeding: None.  Movement: Present. Leaking Fluid denies.  ----------------------------------------------------------------------------------- The following portions of the patient's history were reviewed and updated as appropriate: allergies, current medications, past family history, past medical history, past social history, past surgical history and problem list. Problem list updated.  Objective  Blood pressure 120/80, weight 192 lb (87.1 kg), last menstrual period 12/20/2020. Pregravid weight 170 lb (77.1 kg) Total Weight Gain 22 lb (9.979 kg) Urinalysis: Urine Protein    Urine Glucose    Fetal Status: Fetal Heart Rate (bpm): 130   Movement: Present     General:  Alert, oriented and cooperative. Patient is in no acute distress.  Skin: Skin is warm and dry. No rash noted.   Cardiovascular: Normal heart rate noted  Respiratory: Normal respiratory effort, no problems with respiration noted  Abdomen: Soft, gravid, appropriate for gestational age. Pain/Pressure: Absent     Pelvic:  Cervical exam deferred        Extremities: Normal range of motion.  Edema: None  Mental Status: Normal mood and affect. Normal behavior. Normal judgment and thought content.   Assessment   35 y.o. G3P2002 at [redacted]w[redacted]d by  08/25/2021, by Ultrasound presenting for routine prenatal visit  Plan   THIRD Problems (from 03/21/21 to present)    Problem Noted Resolved   Supervision of high risk pregnancy, antepartum 03/21/2021 by Mirna Mires, CNM No   Overview Addendum 04/03/2021   1:15 PM by Mirna Mires, CNM     Clinic  Prenatal Labs  Dating  Blood type: --/--/B POS Performed at [facility], [address] [place], [place] [address]  (03/29 1829)   Genetic Screen 1 Screen:    AFP:     Quad:     NIPS:xy, negative Antibody: negative  Anatomic [place]  Rubella:  immune  GTT Early: 125        Third trimester:  RPR:   NR  Flu vaccine  HBsAg:   negative  TDaP vaccine                                               Rhogam: HIV:   NR  Baby Food       Bottle         GBS: (For PCN allergy, check sensitivities)  Contraception  HBZ:JIRC  Circumcision    Pediatrician    Support Person     High BMI Late to care Subchorionic bleed         Previous Version       Preterm labor symptoms and general obstetric precautions including but not limited to vaginal bleeding, contractions, leaking of fluid and fetal movement were reviewed in detail with the patient. Please refer to After Visit Summary for other counseling recommendations.   - 28 week labs nv  Return in about 4 weeks (around 05/23/2021) for 28 week labs, routine prenatal.   Thomasene Mohair, MD, [facility], [facility] 04/25/2021 11:32 AM

## 2021-05-23 ENCOUNTER — Other Ambulatory Visit: Payer: PRIVATE HEALTH INSURANCE

## 2021-05-23 ENCOUNTER — Encounter: Payer: PRIVATE HEALTH INSURANCE | Admitting: Obstetrics and Gynecology

## 2021-05-31 ENCOUNTER — Encounter: Payer: Self-pay | Admitting: Advanced Practice Midwife

## 2021-05-31 ENCOUNTER — Ambulatory Visit (INDEPENDENT_AMBULATORY_CARE_PROVIDER_SITE_OTHER): Payer: PRIVATE HEALTH INSURANCE | Admitting: Advanced Practice Midwife

## 2021-05-31 ENCOUNTER — Other Ambulatory Visit: Payer: Self-pay

## 2021-05-31 ENCOUNTER — Other Ambulatory Visit: Payer: PRIVATE HEALTH INSURANCE

## 2021-05-31 VITALS — BP 116/80 | Wt 192.0 lb

## 2021-05-31 DIAGNOSIS — Z113 Encounter for screening for infections with a predominantly sexual mode of transmission: Secondary | ICD-10-CM

## 2021-05-31 DIAGNOSIS — O99210 Obesity complicating pregnancy, unspecified trimester: Secondary | ICD-10-CM

## 2021-05-31 DIAGNOSIS — Z131 Encounter for screening for diabetes mellitus: Secondary | ICD-10-CM

## 2021-05-31 DIAGNOSIS — O0992 Supervision of high risk pregnancy, unspecified, second trimester: Secondary | ICD-10-CM

## 2021-05-31 DIAGNOSIS — Z3A27 27 weeks gestation of pregnancy: Secondary | ICD-10-CM

## 2021-05-31 LAB — POCT URINALYSIS DIPSTICK OB
Glucose, UA: NEGATIVE
POC,PROTEIN,UA: NEGATIVE

## 2021-05-31 NOTE — Progress Notes (Signed)
ROB- 1 hr GTT, no concerns 

## 2021-05-31 NOTE — Addendum Note (Signed)
Addended by: Donnetta Hail on: 05/31/2021 11:01 AM   Modules accepted: Orders

## 2021-05-31 NOTE — Progress Notes (Signed)
Routine Prenatal Care Visit  Subjective  Brenda Dodson is a 35 y.o. G3P2002 at [redacted]w[redacted]d being seen today for ongoing prenatal care.  She is currently monitored for the following issues for this high-risk pregnancy and has Supervision of high risk pregnancy, antepartum and Obesity in pregnancy, antepartum on their problem list.  ----------------------------------------------------------------------------------- Patient reports some lower extremity swelling. She denies headache, visual changes, epigastric pain.   Contractions: Not present. Vag. Bleeding: None.  Movement: Present. Leaking Fluid denies.  ----------------------------------------------------------------------------------- The following portions of the patient's history were reviewed and updated as appropriate: allergies, current medications, past family history, past medical history, past social history, past surgical history and problem list. Problem list updated.  Objective  Blood pressure 116/80, weight 192 lb (87.1 kg), last menstrual period 12/20/2020. Pregravid weight 170 lb (77.1 kg) Total Weight Gain 22 lb (9.979 kg) Urinalysis: Urine Protein    Urine Glucose    Fetal Status: Fetal Heart Rate (bpm): 139 Fundal Height: 28 cm Movement: Present     General:  Alert, oriented and cooperative. Patient is in no acute distress.  Skin: Skin is warm and dry. No rash noted.   Cardiovascular: Normal heart rate noted  Respiratory: Normal respiratory effort, no problems with respiration noted  Abdomen: Soft, gravid, appropriate for gestational age. Pain/Pressure: Absent     Pelvic:  Cervical exam deferred        Extremities: Normal range of motion.  Edema: Trace  Mental Status: Normal mood and affect. Normal behavior. Normal judgment and thought content.   Assessment   35 y.o. G3P2002 at [redacted]w[redacted]d by  08/25/2021, by Ultrasound presenting for routine prenatal visit  Plan   THIRD Problems (from 03/21/21 to present)    Problem Noted  Resolved   Obesity in pregnancy, antepartum 04/25/2021 by Mirna Mires, CNM No   Supervision of high risk pregnancy, antepartum 03/21/2021 by Mirna Mires, CNM No   Overview Addendum 04/25/2021 12:24 PM by Mirna Mires, CNM     Clinic  Prenatal Labs  Dating  Blood type: --/--/B POS Performed at [facility], [address] [place], [place] [address]  (03/29 7209)   Genetic Screen 1 Screen:    AFP:     Quad:     NIPS:xy, negative Antibody: negative  Anatomic [place]  Rubella:  immune  GTT Early: 125        Third trimester:  RPR:   NR  Flu vaccine  HBsAg:   negative  TDaP vaccine                                               Rhogam: HIV:   NR  Baby Food       Bottle         GBS: (For PCN allergy, check sensitivities)  Contraception  OBS:JGGE  Circumcision    Pediatrician    Support Person     High BMI- weekly  Testing at 36 weeks.Marland Kitchen Ultrasounds at [facility]- Anatomy scan at 21 weeks.- [facility] has set up rescan to check growth. Late to care Subchorionic bleed- first trimester- resolved.              Preterm labor symptoms and general obstetric precautions including but not limited to vaginal bleeding, contractions, leaking of fluid and fetal movement were reviewed in detail with the patient. Please refer to After Visit Summary  for other counseling recommendations.   Return in about 2 weeks (around 06/14/2021) for rob.  Tresea Mall, CNM 05/31/2021 10:56 AM

## 2021-05-31 NOTE — Patient Instructions (Signed)
Third Trimester of Pregnancy  The third trimester of pregnancy is from week 28 through week 40. This is also called months 7 through 9. This trimester is when your unborn baby (fetus) is growing very fast. At the end of the ninth month, the unborn baby is about20 inches long. It weighs about 6-10 pounds. Body changes during your third trimester Your body continues to go through many changes during this time. The changesvary and generally return to normal after the baby is born. Physical changes Your weight will continue to increase. You may gain 25-35 pounds (11-16 kg) by the end of the pregnancy. If you are underweight, you may gain 28-40 lb (about 13-18 kg). If you are overweight, you may gain 15-25 lb (about 7-11 kg). You may start to get stretch marks on your hips, belly (abdomen), and breasts. Your breasts will continue to grow and may hurt. A yellow fluid (colostrum) may leak from your breasts. This is the first milk you are making for your baby. You may have changes in your hair. Your belly button may stick out. You may have more swelling in your hands, face, or ankles. Health changes You may have heartburn. You may have trouble pooping (constipation). You may get hemorrhoids. These are swollen veins in the butt that can itch or get painful. You may have swollen veins (varicose veins) in your legs. You may have more body aches in the pelvis, back, or thighs. You may have more tingling or numbness in your hands, arms, and legs. The skin on your belly may also feel numb. You may feel short of breath as your womb (uterus) gets bigger. Other changes You may pee (urinate) more often. You may have more problems sleeping. You may notice the unborn baby "dropping," or moving lower in your belly. You may have more discharge coming from your vagina. Your joints may feel loose, and you may have pain around your pelvic bone. Follow these instructions at home: Medicines Take over-the-counter  and prescription medicines only as told by your doctor. Some medicines are not safe during pregnancy. Take a prenatal vitamin that contains at least 600 micrograms (mcg) of folic acid. Eating and drinking Eat healthy meals that include: Fresh fruits and vegetables. Whole grains. Good sources of protein, such as meat, eggs, or tofu. Low-fat dairy products. Avoid raw meat and unpasteurized juice, milk, and cheese. These carry germs that can harm you and your baby. Eat 4 or 5 small meals rather than 3 large meals a day. You may need to take these actions to prevent or treat trouble pooping: Drink enough fluids to keep your pee (urine) pale yellow. Eat foods that are high in fiber. These include beans, whole grains, and fresh fruits and vegetables. Limit foods that are high in fat and sugar. These include fried or sweet foods. Activity Exercise only as told by your doctor. Stop exercising if you start to have cramps in your womb. Avoid heavy lifting. Do not exercise if it is too hot or too humid, or if you are in a place of great height (high altitude). If you choose to, you may have sex unless your doctor tells you not to. Relieving pain and discomfort Take breaks often, and rest with your legs raised (elevated) if you have leg cramps or low back pain. Take warm water baths (sitz baths) to soothe pain or discomfort caused by hemorrhoids. Use hemorrhoid cream if your doctor approves. Wear a good support bra if your breasts are tender. If   you develop bulging, swollen veins in your legs: Wear support hose as told by your doctor. Raise your feet for 15 minutes, 3-4 times a day. Limit salt in your food. Safety Talk to your doctor before traveling far distances. Do not use hot tubs, steam rooms, or saunas. Wear your seat belt at all times when you are in a car. Talk with your doctor if someone is hurting you or yelling at you a lot. Preparing for your baby's arrival To prepare for the arrival  of your baby: Take prenatal classes. Visit the hospital and tour the maternity area. Buy a rear-facing car seat. Learn how to install it in your car. Prepare the baby's room. Take out all pillows and stuffed animals from the baby's crib. General instructions Avoid cat litter boxes and soil used by cats. These carry germs that can cause harm to the baby and can cause a loss of your baby by miscarriage or stillbirth. Do not douche or use tampons. Do not use scented sanitary pads. Do not smoke or use any products that contain nicotine or tobacco. If you need help quitting, ask your doctor. Do not drink alcohol. Do not use herbal medicines, illegal drugs, or medicines that were not approved by your doctor. Chemicals in these products can affect your baby. Keep all follow-up visits. This is important. Where to find more information American Pregnancy Association: americanpregnancy.org American College of Obstetricians and Gynecologists: www.acog.org Office on Women's Health: womenshealth.gov/pregnancy Contact a doctor if: You have a fever. You have mild cramps or pressure in your lower belly. You have a nagging pain in your belly area. You vomit, or you have watery poop (diarrhea). You have bad-smelling fluid coming from your vagina. You have pain when you pee, or your pee smells bad. You have a headache that does not go away when you take medicine. You have changes in how you see, or you see spots in front of your eyes. Get help right away if: Your water breaks. You have regular contractions that are less than 5 minutes apart. You are spotting or bleeding from your vagina. You have very bad belly cramps or pain. You have trouble breathing. You have chest pain. You faint. You have not felt the baby move for the amount of time told by your doctor. You have new or increased pain, swelling, or redness in an arm or leg. Summary The third trimester is from week 28 through week 40 (months 7  through 9). This is the time when your unborn baby is growing very fast. During this time, your discomfort may increase as you gain weight and as your baby grows. Get ready for your baby to arrive by taking prenatal classes, buying a rear-facing car seat, and preparing the baby's room. Get help right away if you are bleeding from your vagina, you have chest pain and trouble breathing, or you have not felt the baby move for the amount of time told by your doctor. This information is not intended to replace advice given to you by your health care provider. Make sure you discuss any questions you have with your healthcare provider. Document Revised: 05/04/2020 Document Reviewed: 03/10/2020 Elsevier Patient Education  2022 Elsevier Inc.  

## 2021-06-01 LAB — 28 WEEK RH+PANEL
Basophils Absolute: 0 10*3/uL (ref 0.0–0.2)
Basos: 0 %
EOS (ABSOLUTE): 0.1 10*3/uL (ref 0.0–0.4)
Eos: 1 %
Gestational Diabetes Screen: 154 mg/dL — ABNORMAL HIGH (ref 65–139)
HIV Screen 4th Generation wRfx: NONREACTIVE
Hematocrit: 35.1 % (ref 34.0–46.6)
Hemoglobin: 10.9 g/dL — ABNORMAL LOW (ref 11.1–15.9)
Immature Grans (Abs): 0 10*3/uL (ref 0.0–0.1)
Immature Granulocytes: 0 %
Lymphocytes Absolute: 1.3 10*3/uL (ref 0.7–3.1)
Lymphs: 17 %
MCH: 26.5 pg — ABNORMAL LOW (ref 26.6–33.0)
MCHC: 31.1 g/dL — ABNORMAL LOW (ref 31.5–35.7)
MCV: 85 fL (ref 79–97)
Monocytes Absolute: 0.5 10*3/uL (ref 0.1–0.9)
Monocytes: 7 %
Neutrophils Absolute: 5.5 10*3/uL (ref 1.4–7.0)
Neutrophils: 75 %
Platelets: 270 10*3/uL (ref 150–450)
RBC: 4.11 x10E6/uL (ref 3.77–5.28)
RDW: 13.4 % (ref 11.7–15.4)
RPR Ser Ql: NONREACTIVE
WBC: 7.4 10*3/uL (ref 3.4–10.8)

## 2021-06-14 ENCOUNTER — Ambulatory Visit (INDEPENDENT_AMBULATORY_CARE_PROVIDER_SITE_OTHER): Payer: PRIVATE HEALTH INSURANCE | Admitting: Obstetrics and Gynecology

## 2021-06-14 ENCOUNTER — Other Ambulatory Visit: Payer: Self-pay

## 2021-06-14 ENCOUNTER — Encounter: Payer: Self-pay | Admitting: Obstetrics and Gynecology

## 2021-06-14 VITALS — BP 116/72 | Ht <= 58 in | Wt 191.8 lb

## 2021-06-14 DIAGNOSIS — O0990 Supervision of high risk pregnancy, unspecified, unspecified trimester: Secondary | ICD-10-CM

## 2021-06-14 DIAGNOSIS — O219 Vomiting of pregnancy, unspecified: Secondary | ICD-10-CM

## 2021-06-14 DIAGNOSIS — R7309 Other abnormal glucose: Secondary | ICD-10-CM

## 2021-06-14 DIAGNOSIS — O0993 Supervision of high risk pregnancy, unspecified, third trimester: Secondary | ICD-10-CM

## 2021-06-14 DIAGNOSIS — Z3A29 29 weeks gestation of pregnancy: Secondary | ICD-10-CM

## 2021-06-14 DIAGNOSIS — Z23 Encounter for immunization: Secondary | ICD-10-CM | POA: Diagnosis not present

## 2021-06-14 DIAGNOSIS — O99210 Obesity complicating pregnancy, unspecified trimester: Secondary | ICD-10-CM

## 2021-06-14 LAB — POCT URINALYSIS DIPSTICK OB: Glucose, UA: NEGATIVE

## 2021-06-14 MED ORDER — DOCUSATE SODIUM 100 MG PO CAPS: 100.0000 mg | ORAL_CAPSULE | Freq: Two times a day (BID) | ORAL | 3 refills | Status: DC | PRN

## 2021-06-14 MED ORDER — PROMETHAZINE HCL 25 MG PO TABS: 25.0000 mg | ORAL_TABLET | Freq: Four times a day (QID) | ORAL | 3 refills | Status: DC | PRN

## 2021-06-14 MED ORDER — ONDANSETRON 4 MG PO TBDP: 4.0000 mg | ORAL_TABLET | Freq: Four times a day (QID) | ORAL | 3 refills | Status: DC | PRN

## 2021-06-14 NOTE — Progress Notes (Signed)
Routine Prenatal Care Visit  Subjective  Brenda Dodson is a 35 y.o. G3P2002 at [redacted]w[redacted]d being seen today for ongoing prenatal care.  She is currently monitored for the following issues for this high-risk pregnancy and has Supervision of high risk pregnancy, antepartum and Obesity in pregnancy, antepartum on their problem list.  ----------------------------------------------------------------------------------- Patient reports that she has been struggling with nausea and vomiting during pregnancy.  She reports that when she has episodes of vomiting she also has leakage of urine and stress incontinence.  This creates a difficult bathroom situation.  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  She reports she feels like she gets adequate rest during the day and is able to sleep.  Her significant other also works night shift.  She is tearful during today's visit.  She feels like this is the hardest pregnancy has had yet.  She is concerned that she is not eating sufficiently because of her nausea and the pregnancy and that it will negatively impact her baby.  She feels like her weight has fluctuated.  She also reports that she does not want to weigh 200 pounds. Contractions: Not present. Vag. Bleeding: None.  Movement: Present. Denies leaking of fluid.  ----------------------------------------------------------------------------------- The following portions of the patient's history were reviewed and updated as appropriate: allergies, current medications, past family history, past medical history, past social history, past surgical history and problem list. Problem list updated.   Objective  Blood pressure 116/72, height 4\' 9"  (1.448 m), weight 191 lb 12.8 oz (87 kg), last menstrual period 12/20/2020. Pregravid weight 170 lb (77.1 kg) Total Weight Gain 21 lb 12.8 oz (9.888 kg) Urinalysis:      Fetal Status:     Movement: Present     General:  Alert, oriented and cooperative.  Patient is in no acute distress.  Skin: Skin is warm and dry. No rash noted.   Cardiovascular: Normal heart rate noted  Respiratory: Normal respiratory effort, no problems with respiration noted  Abdomen: Soft, gravid, appropriate for gestational age. Pain/Pressure: Absent     Pelvic:  Cervical exam deferred        Extremities: Normal range of motion.     Mental Status: Normal mood and affect. Normal behavior. Normal judgment and thought content.     Assessment   35 y.o. G3P2002 at [redacted]w[redacted]d by  08/25/2021, by Ultrasound presenting for routine prenatal visit  Plan   THIRD Problems (from 03/21/21 to present)     Problem Noted Resolved   Obesity in pregnancy, antepartum 04/25/2021 by 04/27/2021, CNM No   Supervision of high risk pregnancy, antepartum 03/21/2021 by 05/21/2021, CNM No   Overview Addendum 06/14/2021  9:44 AM by 08/15/2021, MD      Nursing Staff Provider  Office Location  [facility] Dating    Language  English Anatomy Natale Milch  complete  Flu Vaccine   Genetic Screen  NIPS: XY normal  TDaP vaccine   06/14/2021 Hgb A1C or  GTT Early :125 Third trimester : 154  Covid    LAB RESULTS   Rhogam  Not needed Blood Type B/Positive/-- (04/19 1145)   Feeding Plan bottle Antibody Negative (04/19 1145)  Contraception  Rubella 21.50 (04/19 1145)  Circumcision  RPR Non Reactive (06/22 1050)   Pediatrician   HBsAg Negative (04/19 1145)   Support Person  HIV Non Reactive (06/22 1050)  Prenatal Classes  Varicella Immune    GBS  (For PCN allergy,  check sensitivities)   BTL Consent     VBAC Consent  Pap  NIL    Hgb Electro      CF      SMA          Obesity in pregnancy Pregravid BMI: 36 NSTs weekly at 37 weeks  Ultrasounds at [facility]- Anatomy scan at 21 weeks.- [facility] has set up rescan to check growth.  Late to care Subchorionic bleed- first trimester- resolved.             3 hour glucose ordered and discussed TDAP today Reviewed management plan for hyperemesis.   Prescription sent for Phenergan, Zofran and Colace.  Discussed brat diet.  Is regarding appropriate weight gain so far in pregnancy.  Patient is having struggles with depression and anxiety.   I reviewed options with her regarding referral to psychiatry for therapy as well as initiation of medication.  At this time she declines any therapies or treatments.  She reports that she will continue to talk to her mother and sisters who are supportive of her.  [id number]  Gestational age appropriate obstetric precautions including but not limited to vaginal bleeding, contractions, leaking of fluid and fetal movement were reviewed in detail with the patient.    Return in about 1 week (around 06/21/2021) for ROB with 3hr GTT.  Natale Milch MD [facility], [facility] 06/14/2021, 9:47 AM

## 2021-06-14 NOTE — Patient Instructions (Signed)
Initial steps to help :   B6 (pyridoxine) 25 mg,  3-4 times a day- 200 mg a day total Unisom (doxylamine) 25 mg at bedtime **B6 and Unisom are available as a combination prescription medications called diclegis and bonjesta  B1 (thiamin)  50-100 mg 1-2 a day-  100 mg a day total  Continue prenatal vitamin with iron and thiamin. If it is not tolerated switch to 1 mg of folic acid.  Can add medication for gastric reflux if needed.  Subsequent steps to be added to B1, B6, and Unisom:  Antihistamine (one of the following medications) Dramamine      25-50 mg every 4-6 hours Benadryl      25-50 mg every 4-6 hours Meclizine      25 mg every 6 hours  2. Dopamine Antagonist (one of the following medications) Metoclopramide  (Reglan)  5-10 mg every 6-8 hours         PO Promethazine   (Phenergan)   12.5-25 mg every 4-6 hours      PO or rectal Prochlorperazine  (Compazine)  5-10 mg every 6-8 hours     25mg  BID rectally   Subsequent steps if there has still not been improvement in symptoms:  3. Daily stool softner:  Colace 100 mg twice a day  4. Ondansetron  (Zofran)   4-8 mg every 6-8 hours  Hyperemesis Gravidarum Hyperemesis gravidarum is a severe form of nausea and vomiting that happens during pregnancy. Hyperemesis is worse than morning sickness. It may cause you to have nausea or vomiting all day for many days. It may keep you from eating and drinking enough food and liquids, which can lead to dehydration, malnutrition, and weight loss. Hyperemesis usually occurs during the first half (the first 20 weeks) of pregnancy. It often goes away once a woman is in her second half of pregnancy. However, sometimes hyperemesis continues through anentire pregnancy. What are the causes? The cause of this condition is not known. It may be associated with: Changes in hormones in the body during pregnancy. Changes in the gastrointestinal system. Genetic or inherited conditions. What are the signs or  symptoms? Symptoms of this condition include: Severe nausea and vomiting that does not go away. Problems keeping food down. Weight loss. Loss of body fluid (dehydration). Loss of appetite. You may have no desire to eat or you may not like the food you have previously enjoyed. How is this diagnosed? This condition may be diagnosed based on your medical history, your symptoms,and a physical exam. You may also have other tests, including: Blood tests. Urine tests. Blood pressure tests. Ultrasound to look for problems with the placenta or to check if you are pregnant with more than one baby. How is this treated? This condition is managed by controlling symptoms. This may include: Following an eating plan. This can help to lessen nausea and vomiting. Treatments that do not use medicine. These include acupressure bracelets, hypnosis, and eating or drinking foods or fluids that contain ginger, ginger ale, or ginger tea. Taking prescription medicine or over-the-counter medicine as told by your health care provider. Continuing to take prenatal vitamins. You may need to change what kind you take and when you take them. Follow your health care provider's instructions about prenatal vitamins. An eating plan and medicines are often used together to help control symptoms. If medicines do not help relieve nausea and vomiting, you may need to receivefluids through an IV at the hospital. Follow these instructions at home: To help  relieve your symptoms, listen to your body. Everyone is different and has different preferences. Find what works best for you. Here are some thingsyou can try to help relieve your symptoms: Meals and snacks  Eat 5-6 small meals daily instead of 3 large meals. Eating small meals and snacks can help you avoid an empty stomach. Before getting out of bed, eat a couple of crackers to avoid moving around on an empty stomach. Eat a protein-rich snack before bed. Examples include cheese  and crackers, or a peanut butter sandwich made with 1 slice of whole-wheat bread and 1 tsp (5 g) of peanut butter. Eat and drink slowly. Try eating starchy foods as these are usually tolerated well. Examples include cereal, toast, bread, potatoes, pasta, rice, and pretzels. Eat at least one serving of protein with your meals and snacks. Protein options include lean meats, poultry, seafood, beans, nuts, nut butters, eggs, cheese, and yogurt. Eat or suck on things that have ginger in them. It may help to relieve nausea. Add  tsp (0.44 g) ground ginger to hot tea, or choose ginger tea.  Fluids It is important to stay hydrated. Try to: Drink small amounts of fluids often. Drink fluids 30 minutes before or after a meal to help lessen the feeling of a full stomach. Drink 100% fruit juice or an electrolyte drink. An electrolyte drink contains sodium, potassium, and chloride. Drink fluids that are cold, clear, and carbonated or sour. These include lemonade, ginger ale, lemon-lime soda, ice water, and sparkling water. Things to avoid Avoid the following: Eating foods that trigger your symptoms. These may include spicy foods, coffee, high-fat foods, very sweet foods, and acidic foods. Drinking more than 1 cup of fluid at a time. Skipping meals. Nausea can be more intense on an empty stomach. If you cannot tolerate food, do not force it. Try sucking on ice chips or other frozen items and make up for missed calories later. Lying down within 2 hours after eating. Being exposed to environmental triggers. These may include food smells, smoky rooms, closed spaces, rooms with strong smells, warm or humid places, overly loud and noisy rooms, and rooms with motion or flickering lights. Try eating meals in a well-ventilated area that is free of strong smells. Making quick and sudden changes in your movement. Taking iron pills and multivitamins that contain iron. If you take prescription iron pills, do not stop  taking them unless your health care provider approves. Preparing food. The smell of food can spoil your appetite or trigger nausea. General instructions Brush your teeth or use a mouth rinse after meals. Take over-the-counter and prescription medicines only as told by your health care provider. Follow instructions from your health care provider about eating or drinking restrictions. Talk with your health care provider about starting a supplement of vitamin B6. Continue to take your prenatal vitamins as told by your health care provider. If you are having trouble taking your prenatal vitamins, talk with your health care provider about other options. Keep all follow-up visits. This is important. Follow-up visits include prenatal visits. Contact a health care provider if: You have pain in your abdomen. You have a severe headache. You have vision problems. You are losing weight. You feel weak or dizzy. You cannot eat or drink without vomiting, especially if this goes on for a full day. Get help right away if: You cannot drink fluids without vomiting. You vomit blood. You have constant nausea and vomiting. You are very weak. You faint. You have  a fever and your symptoms suddenly get worse. Summary Hyperemesis gravidarum is a severe form of nausea and vomiting that happens during pregnancy. Making some changes to your eating habits may help relieve nausea and vomiting. This condition may be managed with lifestyle changes and medicines as prescribed by your health care provider. If medicines do not help relieve nausea and vomiting, you may need to receive fluids through an IV at the hospital. This information is not intended to replace advice given to you by your health care provider. Make sure you discuss any questions you have with your healthcare provider. Document Revised: 06/20/2020 Document Reviewed: 06/20/2020 Elsevier Patient Education  2022 [employer].

## 2021-06-15 ENCOUNTER — Other Ambulatory Visit: Payer: Self-pay | Admitting: Obstetrics

## 2021-06-15 DIAGNOSIS — IMO0001 Reserved for inherently not codable concepts without codable children: Secondary | ICD-10-CM

## 2021-06-15 DIAGNOSIS — O0930 Supervision of pregnancy with insufficient antenatal care, unspecified trimester: Secondary | ICD-10-CM

## 2021-06-15 DIAGNOSIS — O99213 Obesity complicating pregnancy, third trimester: Secondary | ICD-10-CM

## 2021-06-15 DIAGNOSIS — O418X30 Other specified disorders of amniotic fluid and membranes, third trimester, not applicable or unspecified: Secondary | ICD-10-CM

## 2021-06-15 DIAGNOSIS — O468X3 Other antepartum hemorrhage, third trimester: Secondary | ICD-10-CM

## 2021-06-22 ENCOUNTER — Other Ambulatory Visit: Payer: Self-pay

## 2021-06-22 ENCOUNTER — Ambulatory Visit: Payer: Medicaid Other | Attending: Maternal & Fetal Medicine

## 2021-06-22 DIAGNOSIS — O0933 Supervision of pregnancy with insufficient antenatal care, third trimester: Secondary | ICD-10-CM | POA: Diagnosis not present

## 2021-06-22 DIAGNOSIS — O418X30 Other specified disorders of amniotic fluid and membranes, third trimester, not applicable or unspecified: Secondary | ICD-10-CM | POA: Diagnosis not present

## 2021-06-22 DIAGNOSIS — Z3A00 Weeks of gestation of pregnancy not specified: Secondary | ICD-10-CM | POA: Insufficient documentation

## 2021-06-22 DIAGNOSIS — E669 Obesity, unspecified: Secondary | ICD-10-CM | POA: Diagnosis not present

## 2021-06-22 DIAGNOSIS — O468X3 Other antepartum hemorrhage, third trimester: Secondary | ICD-10-CM

## 2021-06-22 DIAGNOSIS — O99213 Obesity complicating pregnancy, third trimester: Secondary | ICD-10-CM | POA: Diagnosis present

## 2021-06-22 DIAGNOSIS — O0930 Supervision of pregnancy with insufficient antenatal care, unspecified trimester: Secondary | ICD-10-CM

## 2021-06-22 DIAGNOSIS — Z3A30 30 weeks gestation of pregnancy: Secondary | ICD-10-CM

## 2021-06-23 ENCOUNTER — Ambulatory Visit (INDEPENDENT_AMBULATORY_CARE_PROVIDER_SITE_OTHER): Payer: PRIVATE HEALTH INSURANCE | Admitting: Obstetrics

## 2021-06-23 ENCOUNTER — Other Ambulatory Visit: Payer: PRIVATE HEALTH INSURANCE

## 2021-06-23 VITALS — BP 100/60 | Wt 192.0 lb

## 2021-06-23 DIAGNOSIS — O0990 Supervision of high risk pregnancy, unspecified, unspecified trimester: Secondary | ICD-10-CM

## 2021-06-23 DIAGNOSIS — Z3A31 31 weeks gestation of pregnancy: Secondary | ICD-10-CM

## 2021-06-23 DIAGNOSIS — R7309 Other abnormal glucose: Secondary | ICD-10-CM

## 2021-06-23 LAB — POCT URINALYSIS DIPSTICK OB: Glucose, UA: NEGATIVE

## 2021-06-23 NOTE — Progress Notes (Signed)
ROB - 3 hr gtt, Rx for reflux. RM 4

## 2021-06-23 NOTE — Progress Notes (Signed)
Routine Prenatal Care Visit  Subjective  Brenda Dodson is a 35 y.o. G3P2002 at [redacted]w[redacted]d being seen today for ongoing prenatal care.  She is currently monitored for the following issues for this high-risk pregnancy and has Supervision of high risk pregnancy, antepartum and Obesity in pregnancy, antepartum on their problem list.  ----------------------------------------------------------------------------------- Patient reports heartburn, no cramping, no leaking and she would like suggestions for addressing acid reflux..  She is having a three hour GTT today.  .  .   Pincus Large Fluid denies.  ----------------------------------------------------------------------------------- The following portions of the patient's history were reviewed and updated as appropriate: allergies, current medications, past family history, past medical history, past social history, past surgical history and problem list. Problem list updated.  Objective  Blood pressure 100/60, weight 192 lb (87.1 kg), last menstrual period 12/20/2020. Pregravid weight 170 lb (77.1 kg) Total Weight Gain 22 lb (9.979 kg) Urinalysis: Urine Protein Trace  Urine Glucose Negative  Fetal Status:           General:  Alert, oriented and cooperative. Patient is in no acute distress.  Skin: Skin is warm and dry. No rash noted.   Cardiovascular: Normal heart rate noted  Respiratory: Normal respiratory effort, no problems with respiration noted  Abdomen: Soft, gravid, appropriate for gestational age.       Pelvic:  Cervical exam deferred        Extremities: Normal range of motion.     Mental Status: Normal mood and affect. Normal behavior. Normal judgment and thought content.   Assessment   35 y.o. G3P2002 at [redacted]w[redacted]d by  08/25/2021, by Ultrasound presenting for routine prenatal visit  Plan   THIRD Problems (from 03/21/21 to present)    Problem Noted Resolved   Obesity in pregnancy, antepartum 04/25/2021 by Mirna Mires, CNM No    Supervision of high risk pregnancy, antepartum 03/21/2021 by Mirna Mires, CNM No   Overview Addendum 06/23/2021 10:58 AM by Mirna Mires, CNM      Nursing Staff Provider  Office Location  [facility] Dating    Language  English Anatomy US  complete  Flu Vaccine   Genetic Screen  NIPS: XY normal  TDaP vaccine   06/14/2021 Hgb A1C or  GTT Early :125 Third trimester : 154. 3hr on 7/15  Covid    LAB RESULTS   Rhogam  Not needed Blood Type B/Positive/-- (04/19 1145)   Feeding Plan bottle Antibody Negative (04/19 1145)  Contraception  Rubella 21.50 (04/19 1145)  Circumcision  RPR Non Reactive (06/22 1050)   Pediatrician   HBsAg Negative (04/19 1145)   Support Person  HIV Non Reactive (06/22 1050)  Prenatal Classes  Varicella Immune    GBS  (For PCN allergy, check sensitivities)   BTL Consent     VBAC Consent  Pap  NIL    Hgb Electro      CF      SMA          Obesity in pregnancy Pregravid BMI: 36 NSTs weekly at 37 weeks  Ultrasounds at [facility]- Anatomy scan at 21 weeks.- MFM has set up rescan to check growth.  Late to care Subchorionic bleed- first trimester- resolved.             Preterm labor symptoms and general obstetric precautions including but not limited to vaginal bleeding, contractions, leaking of fluid and fetal movement were reviewed in detail with the patient. Please refer to After Visit Summary for other counseling recommendations.  Advised  her to drink less liquids with her meals: avoid eating just before bedtime, and to try Tums as well as a daily Zantac or Pepcid.  Return in about 2 weeks (around 07/07/2021) for return OB.  Mirna Mires, CNM  06/23/2021 11:10 AM

## 2021-06-24 LAB — GESTATIONAL GLUCOSE TOLERANCE
Glucose, Fasting: 58 mg/dL — ABNORMAL LOW (ref 65–94)
Glucose, GTT - 1 Hour: 120 mg/dL (ref 65–179)
Glucose, GTT - 2 Hour: 133 mg/dL (ref 65–154)
Glucose, GTT - 3 Hour: 100 mg/dL (ref 65–139)

## 2021-07-11 ENCOUNTER — Ambulatory Visit (INDEPENDENT_AMBULATORY_CARE_PROVIDER_SITE_OTHER): Payer: PRIVATE HEALTH INSURANCE | Admitting: Obstetrics and Gynecology

## 2021-07-11 ENCOUNTER — Other Ambulatory Visit: Payer: Self-pay

## 2021-07-11 VITALS — BP 120/70 | Ht <= 58 in | Wt 189.0 lb

## 2021-07-11 DIAGNOSIS — Z3A33 33 weeks gestation of pregnancy: Secondary | ICD-10-CM

## 2021-07-11 DIAGNOSIS — O0990 Supervision of high risk pregnancy, unspecified, unspecified trimester: Secondary | ICD-10-CM

## 2021-07-11 LAB — POCT URINALYSIS DIPSTICK OB: Glucose, UA: NEGATIVE

## 2021-07-11 NOTE — Progress Notes (Signed)
Routine Prenatal Care Visit  Subjective  Brenda Dodson is a 35 y.o. G3P2002 at [redacted]w[redacted]d being seen today for ongoing prenatal care.  She is currently monitored for the following issues for this high-risk pregnancy and has Supervision of high risk pregnancy, antepartum and Obesity in pregnancy, antepartum on their problem list.  ----------------------------------------------------------------------------------- Patient reports no complaints.   Contractions: Not present. Vag. Bleeding: None.  Movement: Present. Denies leaking of fluid.  ----------------------------------------------------------------------------------- The following portions of the patient's history were reviewed and updated as appropriate: allergies, current medications, past family history, past medical history, past social history, past surgical history and problem list. Problem list updated.   Objective  Blood pressure 120/70, height 4\' 9"  (1.448 m), weight 189 lb (85.7 kg), last menstrual period 12/20/2020. Pregravid weight 170 lb (77.1 kg) Total Weight Gain 19 lb (8.618 kg) Urinalysis:      Fetal Status: Fetal Heart Rate (bpm): 130 Fundal Height: 31 cm Movement: Present     General:  Alert, oriented and cooperative. Patient is in no acute distress.  Skin: Skin is warm and dry. No rash noted.   Cardiovascular: Normal heart rate noted  Respiratory: Normal respiratory effort, no problems with respiration noted  Abdomen: Soft, gravid, appropriate for gestational age. Pain/Pressure: Present     Pelvic:  Cervical exam deferred        Extremities: Normal range of motion.  Edema: None  Mental Status: Normal mood and affect. Normal behavior. Normal judgment and thought content.     Assessment   35 y.o. G3P2002 at [redacted]w[redacted]d by  08/25/2021, by Ultrasound presenting for routine prenatal visit  Plan   THIRD Problems (from 03/21/21 to present)     Problem Noted Resolved   Obesity in pregnancy, antepartum 04/25/2021 by 04/27/2021, CNM No   Supervision of high risk pregnancy, antepartum 03/21/2021 by 05/21/2021, CNM No   Overview Addendum 06/23/2021 10:58 AM by 06/25/2021, CNM      Nursing Staff Provider  Office Location  [facility] Dating    Language  English Anatomy Mirna Mires  complete  Flu Vaccine   Genetic Screen  NIPS: XY normal  TDaP vaccine   06/14/2021 Hgb A1C or  GTT Early :125 Third trimester : 154. 3hr on 7/15  Covid    LAB RESULTS   Rhogam  Not needed Blood Type B/Positive/-- (04/19 1145)   Feeding Plan bottle Antibody Negative (04/19 1145)  Contraception  Rubella 21.50 (04/19 1145)  Circumcision  RPR Non Reactive (06/22 1050)   Pediatrician   HBsAg Negative (04/19 1145)   Support Person  HIV Non Reactive (06/22 1050)  Prenatal Classes  Varicella Immune    GBS  (For PCN allergy, check sensitivities)   BTL Consent     VBAC Consent  Pap  NIL    Hgb Electro      CF      SMA          Obesity in pregnancy Pregravid BMI: 36 NSTs weekly at 37 weeks  Ultrasounds at [facility]- Anatomy scan at 21 weeks.- MFM has set up rescan to check growth.  Late to care Subchorionic bleed- first trimester- resolved.              Gestational age appropriate obstetric precautions including but not limited to vaginal bleeding, contractions, leaking of fluid and fetal movement were reviewed in detail with the patient.    Return in about 2 weeks (around 07/25/2021) for ROB in person.  Brenda Dodson  Brenda Sevin MD [facility], [facility] 07/11/2021, 5:34 PM

## 2021-07-11 NOTE — Patient Instructions (Signed)
Postpartum Tubal Ligation Postpartum tubal ligation (PPTL) is a procedure to close the fallopian tubes. This is done to prevent pregnancy. When the fallopian tubes are closed, the eggs that the ovaries release cannot enter the uterus, and sperm cannot reach the eggs. PPTL is done right after childbirth or 1-2 days after childbirth, before the uterus returns to its normal position. If you have a cesarean section, it can be performed at the same time as the procedure. Having thisdone after childbirth does not make your stay in the hospital longer. PPTL is sometimes called "getting your tubes tied." You should not have this procedure if you want to get pregnant again or if you are unsure about havingmore children. Tell a health care provider about: Any allergies you have. All medicines you are taking, including vitamins, herbs, eye drops, creams, and over-the-counter medicines. Any problems you or family members have had with anesthetic medicines. Any blood disorders you have. Any surgeries you have had. Any medical conditions you have or have had. Any past pregnancies. What are the risks? Generally, this is a safe procedure. However, problems may occur, including: Infection. Bleeding. Damage to other organs in the abdomen. Side effects from anesthetic medicines. Failure of the procedure. If this happens, you could get pregnant. Ectopic pregnancy. This is a pregnancy in which the egg attaches outside the uterus. What happens before the procedure? Ask your health care provider about: How much pain you can expect to have. What medicines you will be given for pain, especially if you are breastfeeding. What happens during the procedure? If you had a vaginal delivery: An IV will be inserted into one of your veins. You will be given one or more of the following: A medicine to help you relax (sedative). A medicine to numb the area (local anesthetic). A medicine to make you fall asleep (general  anesthetic). A medicine that is injected into an area of your body to numb everything below the injection site (regional anesthetic). If you have been given a general anesthetic, a tube will be put down your throat to help you breathe. Your bladder may be emptied with a small tube (catheter). An incision will be made just below your belly button. Your fallopian tubes will be located and brought up through the incision. Your fallopian tubes will be tied off, burned (cauterized), or blocked with a clip, ring, or clamp. A small part in the center of each fallopian tube may be removed. The incision will be closed with stitches (sutures). A bandage (dressing) will be placed over the incision. If you had a cesarean delivery: Tubal ligation will be done through the incision that was used for the cesarean delivery of your baby. The incision will be closed with sutures. A dressing will be placed over the incision. The procedure may vary among health care providers and hospitals. What happens after the procedure? Your blood pressure, heart rate, breathing rate, and blood oxygen level will be monitored until you leave the hospital. You will be given pain medicine as needed. You will be encouraged to get up early and walk to prevent blood clots. If you were given a sedative during the procedure, it can affect you for several hours. Do not drive or operate machinery until your health care provider says that it is safe. Summary Postpartum tubal ligation is a procedure that closes the fallopian tubes to prevent pregnancy. This procedure is done while you are still in the hospital after childbirth. If you have a cesarean section, it   can be performed at the same time. Having this done after childbirth does not make your stay in the hospital longer. Postpartum tubal ligation is considered permanent. You should not have this procedure if you want to get pregnant again or if you are unsure about having more  children. Talk to your health care provider to see if this procedure is right for you. This information is not intended to replace advice given to you by your health care provider. Make sure you discuss any questions you have with your healthcare provider. Document Revised: 08/12/2020 Document Reviewed: 08/12/2020 Elsevier Patient Education  2022 [employer] Inc[employer]  

## 2021-07-17 ENCOUNTER — Other Ambulatory Visit: Payer: Self-pay | Admitting: Maternal & Fetal Medicine

## 2021-07-17 DIAGNOSIS — Z3A34 34 weeks gestation of pregnancy: Secondary | ICD-10-CM

## 2021-07-17 DIAGNOSIS — O0993 Supervision of high risk pregnancy, unspecified, third trimester: Secondary | ICD-10-CM

## 2021-07-17 DIAGNOSIS — O99213 Obesity complicating pregnancy, third trimester: Secondary | ICD-10-CM

## 2021-07-20 ENCOUNTER — Ambulatory Visit: Payer: Medicaid Other | Attending: Maternal & Fetal Medicine

## 2021-07-20 ENCOUNTER — Other Ambulatory Visit: Payer: Self-pay

## 2021-07-20 VITALS — BP 109/74 | HR 90 | Temp 98.2°F | Resp 20 | Ht <= 58 in | Wt 186.5 lb

## 2021-07-20 DIAGNOSIS — Z3A34 34 weeks gestation of pregnancy: Secondary | ICD-10-CM

## 2021-07-20 DIAGNOSIS — O0993 Supervision of high risk pregnancy, unspecified, third trimester: Secondary | ICD-10-CM | POA: Insufficient documentation

## 2021-07-20 DIAGNOSIS — O99213 Obesity complicating pregnancy, third trimester: Secondary | ICD-10-CM

## 2021-07-20 DIAGNOSIS — O0990 Supervision of high risk pregnancy, unspecified, unspecified trimester: Secondary | ICD-10-CM

## 2021-07-20 DIAGNOSIS — O418X30 Other specified disorders of amniotic fluid and membranes, third trimester, not applicable or unspecified: Secondary | ICD-10-CM

## 2021-07-20 DIAGNOSIS — E669 Obesity, unspecified: Secondary | ICD-10-CM

## 2021-07-20 DIAGNOSIS — O99210 Obesity complicating pregnancy, unspecified trimester: Secondary | ICD-10-CM

## 2021-07-26 ENCOUNTER — Telehealth: Payer: Self-pay

## 2021-07-26 NOTE — Telephone Encounter (Signed)
Pt calling; has turned in to [place] [employer], filled out the forms and paid the fee; wants to be sure they have been filled out; deadline is close; pt has appt tomorrow and can p/u then.  [phone]

## 2021-07-27 ENCOUNTER — Ambulatory Visit (INDEPENDENT_AMBULATORY_CARE_PROVIDER_SITE_OTHER): Payer: PRIVATE HEALTH INSURANCE | Admitting: Obstetrics

## 2021-07-27 ENCOUNTER — Other Ambulatory Visit: Payer: Self-pay

## 2021-07-27 VITALS — BP 110/70 | Wt 187.0 lb

## 2021-07-27 DIAGNOSIS — O0990 Supervision of high risk pregnancy, unspecified, unspecified trimester: Secondary | ICD-10-CM

## 2021-07-27 DIAGNOSIS — Z3A35 35 weeks gestation of pregnancy: Secondary | ICD-10-CM

## 2021-07-27 LAB — POCT URINALYSIS DIPSTICK OB
Glucose, UA: NEGATIVE
POC,PROTEIN,UA: NEGATIVE

## 2021-07-27 NOTE — Progress Notes (Signed)
Routine Prenatal Care Visit  Subjective  Brenda Dodson is a 35 y.o. G3P2002 at [redacted]w[redacted]d being seen today for ongoing prenatal care.  She is currently monitored for the following issues for this high-risk pregnancy and has Supervision of high risk pregnancy, antepartum and Obesity in pregnancy, antepartum on their problem list.  ----------------------------------------------------------------------------------- Patient reports no complaints.   Contractions: Not present. Vag. Bleeding: None.  Movement: Present. Leaking Fluid denies.  ----------------------------------------------------------------------------------- The following portions of the patient's history were reviewed and updated as appropriate: allergies, current medications, past family history, past medical history, past social history, past surgical history and problem list. Problem list updated.  Objective  Blood pressure 110/70, weight 187 lb (84.8 kg), last menstrual period 12/20/2020. Pregravid weight 170 lb (77.1 kg) Total Weight Gain 17 lb (7.711 kg) Urinalysis: Urine Protein Negative  Urine Glucose Negative  Fetal Status:     Movement: Present     General:  Alert, oriented and cooperative. Patient is in no acute distress.  Skin: Skin is warm and dry. No rash noted.   Cardiovascular: Normal heart rate noted  Respiratory: Normal respiratory effort, no problems with respiration noted  Abdomen: Soft, gravid, appropriate for gestational age. Pain/Pressure: Present     Pelvic:  Cervical exam deferred        Extremities: Normal range of motion.     Mental Status: Normal mood and affect. Normal behavior. Normal judgment and thought content.   Assessment   35 y.o. G3P2002 at [redacted]w[redacted]d by  08/25/2021, by Ultrasound presenting for routine prenatal visit  Plan   THIRD Problems (from 03/21/21 to present)    Problem Noted Resolved   Obesity in pregnancy, antepartum 04/25/2021 by Mirna Mires, CNM No   Supervision of high risk  pregnancy, antepartum 03/21/2021 by Mirna Mires, CNM No   Overview Addendum 07/11/2021  4:45 PM by Natale Milch, MD      Nursing Staff Provider  Office Location  [facility] Dating    Language  English Anatomy US  complete  Flu Vaccine   Genetic Screen  NIPS: XY normal  TDaP vaccine   06/14/2021 Hgb A1C or  GTT Early :125 Third trimester : 154. 3hr on 7/15 nml  Covid    LAB RESULTS   Rhogam  Not needed Blood Type B/Positive/-- (04/19 1145)   Feeding Plan bottle Antibody Negative (04/19 1145)  Contraception ? BTL Rubella 21.50 (04/19 1145)  Circumcision  RPR Non Reactive (06/22 1050)   Pediatrician   HBsAg Negative (04/19 1145)   Support Person  HIV Non Reactive (06/22 1050)  Prenatal Classes  Varicella Immune    GBS  (For PCN allergy, check sensitivities)   BTL Consent  07/11/2021    VBAC Consent  Pap  NIL    Hgb Electro      CF      SMA          Obesity in pregnancy Pregravid BMI: 36 NSTs weekly at 37 weeks  Ultrasounds at [facility]- Anatomy scan at 21 weeks.- MFM has set up rescan to check growth.  Late to care Subchorionic bleed- first trimester- resolved.             Preterm labor symptoms and general obstetric precautions including but not limited to vaginal bleeding, contractions, leaking of fluid and fetal movement were reviewed in detail with the patient. Please refer to After Visit Summary for other counseling recommendations.   Return in about 1 week (around 08/03/2021) for return OB, NST, GBS.  Mirna Mires, CNM  07/27/2021 3:53 PM

## 2021-08-02 ENCOUNTER — Encounter: Payer: PRIVATE HEALTH INSURANCE | Admitting: Obstetrics and Gynecology

## 2021-08-02 ENCOUNTER — Encounter: Payer: PRIVATE HEALTH INSURANCE | Admitting: Obstetrics

## 2021-08-03 ENCOUNTER — Ambulatory Visit (INDEPENDENT_AMBULATORY_CARE_PROVIDER_SITE_OTHER): Payer: PRIVATE HEALTH INSURANCE | Admitting: Obstetrics and Gynecology

## 2021-08-03 ENCOUNTER — Other Ambulatory Visit (HOSPITAL_COMMUNITY)
Admission: RE | Admit: 2021-08-03 | Discharge: 2021-08-03 | Disposition: A | Discharge status: Home / Self Care | Payer: Medicaid Other | Source: Ambulatory Visit | Attending: Obstetrics and Gynecology | Admitting: Obstetrics and Gynecology

## 2021-08-03 ENCOUNTER — Encounter: Payer: Self-pay | Admitting: Obstetrics and Gynecology

## 2021-08-03 ENCOUNTER — Other Ambulatory Visit: Payer: Self-pay

## 2021-08-03 VITALS — BP 114/70 | Ht <= 58 in | Wt 189.0 lb

## 2021-08-03 DIAGNOSIS — O0993 Supervision of high risk pregnancy, unspecified, third trimester: Secondary | ICD-10-CM | POA: Diagnosis not present

## 2021-08-03 DIAGNOSIS — Z3A36 36 weeks gestation of pregnancy: Secondary | ICD-10-CM

## 2021-08-03 NOTE — Progress Notes (Signed)
Routine Prenatal Care Visit  Subjective  Brenda Dodson is a 35 y.o. G3P2002 at [redacted]w[redacted]d being seen today for ongoing prenatal care.  She is currently monitored for the following issues for this high-risk pregnancy and has Supervision of high risk pregnancy, antepartum and Obesity in pregnancy, antepartum on their problem list.  ----------------------------------------------------------------------------------- Patient reports no complaints.   Contractions: Irregular. Vag. Bleeding: None.  Movement: Present. Denies leaking of fluid.  ----------------------------------------------------------------------------------- The following portions of the patient's history were reviewed and updated as appropriate: allergies, current medications, past family history, past medical history, past social history, past surgical history and problem list. Problem list updated.   Objective  Blood pressure 114/70, height 4\' 9"  (1.448 m), weight 189 lb (85.7 kg), last menstrual period 12/20/2020. Pregravid weight 170 lb (77.1 kg) Total Weight Gain 19 lb (8.618 kg) Urinalysis:      Fetal Status: Fetal Heart Rate (bpm): RNST   Movement: Present  Presentation: Vertex  General:  Alert, oriented and cooperative. Patient is in no acute distress.  Skin: Skin is warm and dry. No rash noted.   Cardiovascular: Normal heart rate noted  Respiratory: Normal respiratory effort, no problems with respiration noted  Abdomen: Soft, gravid, appropriate for gestational age. Pain/Pressure: Present     Pelvic:  Cervical exam performed Dilation: 2.5 Effacement (%): 50 Station: -3  Extremities: Normal range of motion.  Edema: None  Mental Status: Normal mood and affect. Normal behavior. Normal judgment and thought content.     Assessment   35 y.o. G3P2002 at [redacted]w[redacted]d by  08/25/2021, by Ultrasound presenting for routine prenatal visit  Plan   THIRD Problems (from 03/21/21 to present)     Problem Noted Resolved   Obesity in  pregnancy, antepartum 04/25/2021 by 04/27/2021, CNM No   Supervision of high risk pregnancy, antepartum 03/21/2021 by 05/21/2021, CNM No   Overview Addendum 07/11/2021  4:45 PM by 09/10/2021, MD      Nursing Staff Provider  Office Location  [facility] Dating    Language  English Anatomy Natale Milch  complete  Flu Vaccine   Genetic Screen  NIPS: XY normal  TDaP vaccine   06/14/2021 Hgb A1C or  GTT Early :125 Third trimester : 154. 3hr on 7/15 nml  Covid    LAB RESULTS   Rhogam  Not needed Blood Type B/Positive/-- (04/19 1145)   Feeding Plan bottle Antibody Negative (04/19 1145)  Contraception ? BTL Rubella 21.50 (04/19 1145)  Circumcision  RPR Non Reactive (06/22 1050)   Pediatrician   HBsAg Negative (04/19 1145)   Support Person  HIV Non Reactive (06/22 1050)  Prenatal Classes  Varicella Immune    GBS  (For PCN allergy, check sensitivities)   BTL Consent  07/11/2021    VBAC Consent  Pap  NIL    Hgb Electro      CF      SMA          Obesity in pregnancy Pregravid BMI: 36 NSTs weekly at 37 weeks  Ultrasounds at MFM- Anatomy scan at 21 weeks.- MFM has set up rescan to check growth.  Late to care Subchorionic bleed- first trimester- resolved.              GBS/GC/CT today NST: 125 bpm baseline, moderate variability, 15x15 accelerations, no decelerations. Reactive NST  Gestational age appropriate obstetric precautions including but not limited to vaginal bleeding, contractions, leaking of fluid and fetal movement were reviewed in detail with the  patient.    Return in about 1 week (around 08/10/2021) for ROB/NST.  Natale Milch MD [facility], [facility] 08/03/2021, 12:31 PM

## 2021-08-07 LAB — CERVICOVAGINAL ANCILLARY ONLY
Chlamydia: NEGATIVE
Comment: NEGATIVE
Comment: NEGATIVE
Comment: NORMAL
Neisseria Gonorrhea: NEGATIVE
Trichomonas: NEGATIVE

## 2021-08-07 LAB — CULTURE, BETA STREP (GROUP B ONLY): Strep Gp B Culture: NEGATIVE

## 2021-08-10 ENCOUNTER — Other Ambulatory Visit: Payer: Self-pay

## 2021-08-10 ENCOUNTER — Ambulatory Visit (INDEPENDENT_AMBULATORY_CARE_PROVIDER_SITE_OTHER): Payer: PRIVATE HEALTH INSURANCE | Admitting: Obstetrics and Gynecology

## 2021-08-10 ENCOUNTER — Encounter: Payer: Self-pay | Admitting: Obstetrics and Gynecology

## 2021-08-10 VITALS — BP 122/74 | Wt 188.0 lb

## 2021-08-10 DIAGNOSIS — Z3A37 37 weeks gestation of pregnancy: Secondary | ICD-10-CM

## 2021-08-10 DIAGNOSIS — O99210 Obesity complicating pregnancy, unspecified trimester: Secondary | ICD-10-CM

## 2021-08-10 DIAGNOSIS — O0993 Supervision of high risk pregnancy, unspecified, third trimester: Secondary | ICD-10-CM | POA: Diagnosis not present

## 2021-08-10 DIAGNOSIS — E669 Obesity, unspecified: Secondary | ICD-10-CM

## 2021-08-10 DIAGNOSIS — O99213 Obesity complicating pregnancy, third trimester: Secondary | ICD-10-CM

## 2021-08-10 NOTE — Progress Notes (Signed)
Routine Prenatal Care Visit  Subjective  Brenda Dodson is a 35 y.o. G3P2002 at [redacted]w[redacted]d being seen today for ongoing prenatal care.  She is currently monitored for the following issues for this high-risk pregnancy and has Supervision of high risk pregnancy, antepartum and Obesity in pregnancy, antepartum on their problem list.  ----------------------------------------------------------------------------------- Patient reports no complaints.   Contractions: Irregular. Vag. Bleeding: None.  Movement: Present. Leaking Fluid denies.  ----------------------------------------------------------------------------------- The following portions of the patient's history were reviewed and updated as appropriate: allergies, current medications, past family history, past medical history, past social history, past surgical history and problem list. Problem list updated.  Objective  Blood pressure 122/74, weight 188 lb (85.3 kg), last menstrual period 12/20/2020. Pregravid weight 170 lb (77.1 kg) Total Weight Gain 18 lb (8.165 kg) Urinalysis: Urine Protein    Urine Glucose    Fetal Status: Fetal Heart Rate (bpm): RNST   Movement: Present  Presentation: Vertex  General:  Alert, oriented and cooperative. Patient is in no acute distress.  Skin: Skin is warm and dry. No rash noted.   Cardiovascular: Normal heart rate noted  Respiratory: Normal respiratory effort, no problems with respiration noted  Abdomen: Soft, gravid, appropriate for gestational age. Pain/Pressure: Present     Pelvic:  Cervical exam deferred        Extremities: Normal range of motion.  Edema: None  Mental Status: Normal mood and affect. Normal behavior. Normal judgment and thought content.   NST: Baseline FHR: 140 beats/min Variability: moderate Accelerations: present Decelerations: absent Tocometry: not done  Interpretation:  INDICATIONS: maternal obesity RESULTS:  A NST procedure was performed with FHR monitoring and a normal  baseline established, appropriate time of 20-40 minutes of evaluation, and accels >2 seen w 15x15 characteristics.  Results show a REACTIVE NST.    Assessment   35 y.o. G3P2002 at [redacted]w[redacted]d by  08/25/2021, by Ultrasound presenting for routine prenatal visit  Plan   THIRD Problems (from 03/21/21 to present)     Problem Noted Resolved   Obesity in pregnancy, antepartum 04/25/2021 by Mirna Mires, CNM No   Supervision of high risk pregnancy, antepartum 03/21/2021 by Mirna Mires, CNM No   Overview Addendum 07/11/2021  4:45 PM by Natale Milch, MD      Nursing Staff Provider  Office Location  [facility] Dating    Language  English Anatomy US  complete  Flu Vaccine   Genetic Screen  NIPS: XY normal  TDaP vaccine   06/14/2021 Hgb A1C or  GTT Early :125 Third trimester : 154. 3hr on 7/15 nml  Covid    LAB RESULTS   Rhogam  Not needed Blood Type B/Positive/-- (04/19 1145)   Feeding Plan bottle Antibody Negative (04/19 1145)  Contraception ? BTL Rubella 21.50 (04/19 1145)  Circumcision  RPR Non Reactive (06/22 1050)   Pediatrician   HBsAg Negative (04/19 1145)   Support Person  HIV Non Reactive (06/22 1050)  Prenatal Classes  Varicella Immune    GBS  (For PCN allergy, check sensitivities)   BTL Consent  07/11/2021    VBAC Consent  Pap  NIL    Hgb Electro      CF      SMA          Obesity in pregnancy Pregravid BMI: 36 NSTs weekly at 37 weeks  Ultrasounds at MFM- Anatomy scan at 21 weeks.- MFM has set up rescan to check growth.  Late to care Subchorionic bleed- first trimester- resolved.  Term labor symptoms and general obstetric precautions including but not limited to vaginal bleeding, contractions, leaking of fluid and fetal movement were reviewed in detail with the patient. Please refer to After Visit Summary for other counseling recommendations.   - Reactive NST  Follow up: ROB/NST follow up in 2 weeks    Thomasene Mohair, MD, [facility]  OB/GYN, [facility] 08/10/2021 3:55 PM

## 2021-08-17 ENCOUNTER — Encounter: Payer: PRIVATE HEALTH INSURANCE | Admitting: Obstetrics and Gynecology

## 2021-08-24 ENCOUNTER — Encounter: Payer: PRIVATE HEALTH INSURANCE | Admitting: Obstetrics and Gynecology

## 2021-09-16 ENCOUNTER — Encounter: Payer: Self-pay | Admitting: Emergency Medicine

## 2021-09-16 ENCOUNTER — Emergency Department
Admission: EM | Admit: 2021-09-16 | Discharge: 2021-09-16 | Disposition: A | Discharge status: Home / Self Care | Payer: Medicaid Other | Attending: Emergency Medicine | Admitting: Emergency Medicine

## 2021-09-16 ENCOUNTER — Other Ambulatory Visit: Payer: Self-pay

## 2021-09-16 ENCOUNTER — Emergency Department: Payer: Medicaid Other

## 2021-09-16 DIAGNOSIS — Z20822 Contact with and (suspected) exposure to covid-19: Secondary | ICD-10-CM | POA: Insufficient documentation

## 2021-09-16 DIAGNOSIS — J189 Pneumonia, unspecified organism: Secondary | ICD-10-CM | POA: Insufficient documentation

## 2021-09-16 DIAGNOSIS — R0602 Shortness of breath: Secondary | ICD-10-CM | POA: Diagnosis present

## 2021-09-16 LAB — CBC WITH DIFFERENTIAL/PLATELET
Abs Immature Granulocytes: 0.07 10*3/uL (ref 0.00–0.07)
Basophils Absolute: 0 10*3/uL (ref 0.0–0.1)
Basophils Relative: 0 %
Eosinophils Absolute: 0.2 10*3/uL (ref 0.0–0.5)
Eosinophils Relative: 1 %
HCT: 34.2 % — ABNORMAL LOW (ref 36.0–46.0)
Hemoglobin: 11 g/dL — ABNORMAL LOW (ref 12.0–15.0)
Immature Granulocytes: 1 %
Lymphocytes Relative: 11 %
Lymphs Abs: 1.3 10*3/uL (ref 0.7–4.0)
MCH: 26.3 pg (ref 26.0–34.0)
MCHC: 32.2 g/dL (ref 30.0–36.0)
MCV: 81.6 fL (ref 80.0–100.0)
Monocytes Absolute: 0.9 10*3/uL (ref 0.1–1.0)
Monocytes Relative: 8 %
Neutro Abs: 9.4 10*3/uL — ABNORMAL HIGH (ref 1.7–7.7)
Neutrophils Relative %: 79 %
Platelets: 365 10*3/uL (ref 150–400)
RBC: 4.19 MIL/uL (ref 3.87–5.11)
RDW: 16 % — ABNORMAL HIGH (ref 11.5–15.5)
WBC: 12 10*3/uL — ABNORMAL HIGH (ref 4.0–10.5)
nRBC: 0 % (ref 0.0–0.2)

## 2021-09-16 LAB — BASIC METABOLIC PANEL
Anion gap: 10 (ref 5–15)
BUN: 10 mg/dL (ref 6–20)
CO2: 22 mmol/L (ref 22–32)
Calcium: 8.3 mg/dL — ABNORMAL LOW (ref 8.9–10.3)
Chloride: 104 mmol/L (ref 98–111)
Creatinine, Ser: 0.76 mg/dL (ref 0.44–1.00)
GFR, Estimated: >60 mL/min (ref >60)
Glucose, Bld: 81 mg/dL (ref 70–99)
Potassium: 3.8 mmol/L (ref 3.5–5.1)
Sodium: 136 mmol/L (ref 135–145)

## 2021-09-16 LAB — RESP PANEL BY RT-PCR (FLU A&B, COVID) ARPGX2
Influenza A by PCR: NEGATIVE
Influenza B by PCR: NEGATIVE
SARS Coronavirus 2 by RT PCR: NEGATIVE

## 2021-09-16 LAB — D-DIMER, QUANTITATIVE: D-Dimer, Quant: 0.48 ug/mL-FEU (ref 0.00–0.50)

## 2021-09-16 LAB — POC URINE PREG, ED: Preg Test, Ur: NEGATIVE

## 2021-09-16 MED ORDER — ACETAMINOPHEN 500 MG PO TABS
1000.0000 mg | ORAL_TABLET | Freq: Once | ORAL | Status: AC
  Administered 2021-09-16: 1000 mg via ORAL
  Filled 2021-09-16: qty 2

## 2021-09-16 MED ORDER — IBUPROFEN 400 MG PO TABS
400.0000 mg | ORAL_TABLET | Freq: Once | ORAL | Status: AC
  Administered 2021-09-16: 400 mg via ORAL
  Filled 2021-09-16: qty 1

## 2021-09-16 MED ORDER — DOXYCYCLINE HYCLATE 100 MG PO CAPS: 100.0000 mg | ORAL_CAPSULE | Freq: Two times a day (BID) | ORAL | 0 refills | Status: AC

## 2021-09-16 NOTE — ED Triage Notes (Signed)
Pt via POV from home. Pt c/o cough, nasal congestion, eye drainage, chills, sore throat since Monday, pt states that her newborn was sick. Pt is A&Ox4 and NAD.   Denies hx of COPD, CHF, or asthma.

## 2021-09-16 NOTE — ED Notes (Signed)
Ambulatory sats 97-100% RA

## 2021-09-16 NOTE — ED Provider Notes (Signed)
[facility] Emergency Department Provider Note  ____________________________________________   Event Date/Time   First MD Initiated Contact with Patient 09/16/21 1107     (approximate)  I have reviewed the triage vital signs and the nursing notes.   HISTORY  Chief Complaint URI   HPI Brenda Dodson is a 35 y.o. female with past medical history of approximately 1 week postpartum who presents for assessment approximately 1 week of nonproductive cough, shortness of breath and congestion.  Patient denies any headache, earache, chest pain, abdominal pain or back pain but does state she has a sore throat 3 days ago.  Patient also notes her eyes have been a little puffy and itchy as well as lower right over last couple days.  No significant eye drainage or change in vision.  No significant sore throat today.  Patient denies any nausea, vomiting, diarrhea, burning with urination, rash or extremity pain.  Denies EtOH or illicit drug use.  Denies tobacco abuse.  No other acute concerns at this time.         Past Medical History:  Diagnosis Date   ASCUS with positive high risk HPV cervical    Chlamydia 2017    Patient Active Problem List   Diagnosis Date Noted   Obesity in pregnancy, antepartum 04/25/2021   Supervision of high risk pregnancy, antepartum 03/21/2021    Past Surgical History:  Procedure Laterality Date   APPENDECTOMY     COLPOSCOPY      Prior to Admission medications   Medication Sig Start Date End Date Taking? Authorizing Provider  doxycycline (VIBRAMYCIN) 100 MG capsule Take 1 capsule (100 mg total) by mouth 2 (two) times daily for 7 days. 09/16/21 09/23/21 Yes Gilles Chiquito, MD  docusate sodium (COLACE) 100 MG capsule Take 1 capsule (100 mg total) by mouth 2 (two) times daily as needed. Patient not taking: Reported on 07/20/2021 06/14/21   Natale Milch, MD  Prenatal Vit-Fe Fumarate-FA (PNV PRENATAL PLUS MULTIVITAMIN) 27-1 MG TABS  Take 1 tablet by mouth daily. 01/04/21 01/04/22  [provider]    Allergies Patient has no known allergies.  Family History  Problem Relation Age of Onset   Prostate cancer Father    Seizures Father    Diabetes Mother    Breast cancer Neg Hx    Ovarian cancer Neg Hx    Colon cancer Neg Hx     Social History Social History   Tobacco Use   Smoking status: Never   Smokeless tobacco: Never  Vaping Use   Vaping Use: Never used  Substance Use Topics   Alcohol use: Not Currently   Drug use: Not Currently    Review of Systems  Review of Systems  Constitutional:  Negative for chills and fever.  HENT:  Positive for congestion and sore throat (2-3 days ago, now resolved).   Eyes:  Negative for pain.  Respiratory:  Positive for cough and shortness of breath. Negative for stridor.   Cardiovascular:  Negative for chest pain.  Gastrointestinal:  Negative for vomiting.  Genitourinary:  Negative for dysuria.  Musculoskeletal:  Negative for myalgias.  Skin:  Negative for rash.  Neurological:  Negative for seizures, loss of consciousness and headaches.  Psychiatric/Behavioral:  Negative for suicidal ideas.   All other systems reviewed and are negative.    ____________________________________________   PHYSICAL EXAM:  VITAL SIGNS: ED Triage Vitals [09/16/21 1033]  Enc Vitals Group     BP (!) 142/93  Pulse Rate (!) 104     Resp 20     Temp 99 F (37.2 C)     Temp Source Oral     SpO2 96 %     Weight 177 lb (80.3 kg)     Height 4\' 9"  (1.448 m)     Head Circumference      Peak Flow      Pain Score 0     Pain Loc      Pain Edu?      Excl. in GC?    Vitals:   09/16/21 1033 09/16/21 1232  BP: (!) 142/93 (!) 143/80  Pulse: (!) 104 88  Resp: 20 18  Temp: 99 F (37.2 C) 98.4 F (36.9 C)  SpO2: 96% 100%   Physical Exam Vitals and nursing note reviewed.  Constitutional:      General: She is not in acute distress.    Appearance: She is well-developed.   HENT:     Head: Normocephalic and atraumatic.     Right Ear: External ear normal.     Left Ear: External ear normal.     Nose: Nose normal.     Mouth/Throat:     Mouth: Mucous membranes are moist.  Eyes:     Conjunctiva/sclera: Conjunctivae normal.  Cardiovascular:     Rate and Rhythm: Regular rhythm. Tachycardia present.     Pulses: Normal pulses.     Heart sounds: No murmur heard. Pulmonary:     Effort: Pulmonary effort is normal. No respiratory distress.     Breath sounds: Normal breath sounds.  Abdominal:     Palpations: Abdomen is soft.     Tenderness: There is no abdominal tenderness.  Musculoskeletal:     Cervical back: Neck supple. No rigidity.  Skin:    General: Skin is warm and dry.  Neurological:     Mental Status: She is alert and oriented to person, place, and time.  Psychiatric:        Mood and Affect: Mood normal.    Oropharynx is unremarkable.  Cranial nerves II through XII are grossly intact.  Patient's eyes are slightly injected bilaterally and there is some mild bilateral periorbital edema without evidence of proptosis and feels otherwise unremarkable.  There is no active drainage.  Lungs are clear bilaterally. ____________________________________________   LABS (all labs ordered are listed, but only abnormal results are displayed)  Labs Reviewed  CBC WITH DIFFERENTIAL/PLATELET - Abnormal; Notable for the following components:      Result Value   WBC 12.0 (*)    Hemoglobin 11.0 (*)    HCT 34.2 (*)    RDW 16.0 (*)    Neutro Abs 9.4 (*)    All other components within normal limits  BASIC METABOLIC PANEL - Abnormal; Notable for the following components:   Calcium 8.3 (*)    All other components within normal limits  RESP PANEL BY RT-PCR (FLU A&B, COVID) ARPGX2  D-DIMER, QUANTITATIVE  POC URINE PREG, ED   ____________________________________________  EKG  Sinus rhythm with a ventricular of 95, normal axis, unremarkable intervals without clearance  of acute ischemia or significant arrhythmia. ____________________________________________  RADIOLOGY  ED MD interpretation: Chest x-ray with bilateral focal consolidations concerning for pneumonia.  No large effusion, overt edema, pneumothorax or other clear acute thoracic process.  Official radiology report(s): DG Chest 2 View  Result Date: 09/16/2021 CLINICAL DATA:  Cough and shortness of breath. EXAM: CHEST - 2 VIEW COMPARISON:  None. FINDINGS: The mediastinal contour  and cardiac silhouette are normal. Consolidation of the right upper lobe, right lung base, medial left lung base are identified. There is no pulmonary edema. The bony structures are normal. IMPRESSION: Bilateral pneumonias. Electronically Signed   By: Sherian Rein M.D.   On: 09/16/2021 11:32    ____________________________________________   PROCEDURES  Procedure(s) performed (including Critical Care):  Procedures   ____________________________________________   INITIAL IMPRESSION / ASSESSMENT AND PLAN / ED COURSE       Patient presents with above-stated history exam for assessment of approximately 1 week of cough, congestion, sore throat now resolved and some puffiness and itchiness in the eyes.  On arrival she is tachycardic at 104 with otherwise stable vital signs on room air.  On exam she has some very slight bilateral periorbital edema and some chemosis but otherwise oropharynx is unremarkable and lungs are clear bilaterally.  Suspect likely community-acquired pneumonia..  No evidence of acute otitis media, retropharyngeal abscess, peritonsillar abscess patient is not appear septic or meningitic. Chest x-ray with bilateral focal consolidations concerning for pneumonia.  No large effusion, overt edema, pneumothorax or other clear acute thoracic process.  COVID influenza PCR is negative.  D-dimer is less than 0.5 and I have low suspicion for PE at this time.  BMP shows no significant electrolyte or metabolic  derangements.  ECG is not suggestive of ACS myocarditis or significant arrhythmia.  CBC shows WBC count of 12 without evidence of significant acute anemia and normal platelets.  Patient is no evidence of hypoxic respiratory failure or respiratory distress.  Patient is stable for discharge with close outpatient PCP follow-up.  Will cover with a course of doxycycline.  Influenza and COVID PCR is negative.  Discharged stable condition.  Strict return precautions advised and discussed.       ____________________________________________   FINAL CLINICAL IMPRESSION(S) / ED DIAGNOSES  Final diagnoses:  Community acquired pneumonia, unspecified laterality    Medications  acetaminophen (TYLENOL) tablet 1,000 mg (1,000 mg Oral Given 09/16/21 1127)  ibuprofen (ADVIL) tablet 400 mg (400 mg Oral Given 09/16/21 1232)     ED Discharge Orders          Ordered    doxycycline (VIBRAMYCIN) 100 MG capsule  2 times daily        09/16/21 1249             Note:  This document was prepared using Dragon voice recognition software and may include unintentional dictation errors.    Gilles Chiquito, MD 09/16/21 1251

## 2021-09-16 NOTE — ED Notes (Signed)
Taken to xray.

## 2021-10-02 ENCOUNTER — Other Ambulatory Visit: Payer: Self-pay

## 2021-10-02 ENCOUNTER — Encounter: Payer: Self-pay | Admitting: Obstetrics and Gynecology

## 2021-10-02 ENCOUNTER — Ambulatory Visit (INDEPENDENT_AMBULATORY_CARE_PROVIDER_SITE_OTHER): Payer: PRIVATE HEALTH INSURANCE | Admitting: Obstetrics and Gynecology

## 2021-10-02 VITALS — BP 128/70 | Ht <= 58 in | Wt 175.2 lb

## 2021-10-02 DIAGNOSIS — Z3009 Encounter for other general counseling and advice on contraception: Secondary | ICD-10-CM

## 2021-10-02 NOTE — Patient Instructions (Addendum)
Laparoscopic Tubal Ligation Laparoscopic tubal ligation is a procedure to close the fallopian tubes. This is done to prevent pregnancy. When the fallopian tubes are closed, the eggs that your ovaries release cannot enter the uterus, and sperm cannot reach the released eggs. You should not have this procedure if you want to get pregnant someday or if you are unsure about having more children. Tell a health care provider about: Any allergies you have. All medicines you are taking, including vitamins, herbs, eye drops, creams, and over-the-counter medicines. Any problems you or family members have had with anesthetic medicines. Any blood disorders you have. Any surgeries you have had. Any medical conditions you have. Whether you are pregnant or may be pregnant. Any past pregnancies. What are the risks? Generally, this is a safe procedure. However, problems may occur, including: Infection. Bleeding. Injury to other organs in the abdomen. Side effects from anesthetic medicines. Failure of the procedure. This procedure can increase your risk of an ectopic pregnancy. This is a pregnancy in which a fertilized egg attaches to the outside of the uterus. What happens before the procedure? Staying hydrated Follow instructions from your health care provider about hydration, which may include: Up to 2 hours before the procedure - you may continue to drink clear liquids, such as water, clear fruit juice, black coffee, and plain tea. Eating and drinking restrictions Follow instructions from your health care provider about eating and drinking, which may include: 8 hours before the procedure - stop eating heavy meals or foods, such as meat, fried foods, or fatty foods. 6 hours before the procedure - stop eating light meals or foods, such as toast or cereal. 6 hours before the procedure - stop drinking milk or drinks that contain milk. 2 hours before the procedure - stop drinking clear  liquids. Medicines Ask your health care provider about: Changing or stopping your regular medicines. This is especially important if you are taking diabetes medicines or blood thinners. Taking medicines such as aspirin and ibuprofen. These medicines can thin your blood. Do not take these medicines unless your health care provider tells you to take them. Taking over-the-counter medicines, vitamins, herbs, and supplements. Surgery safety Ask your health care provider: How your surgery site will be marked. What steps will be taken to help prevent infection. These steps may include: Removing hair at the surgery site. Washing skin with a germ-killing soap. Taking antibiotic medicine. General instructions Do not use any products that contain nicotine or tobacco for at least 4 weeks before the procedure. These products include cigarettes, chewing tobacco, and vaping devices, such as e-cigarettes. If you need help quitting, ask your health care provider. Plan to have someone take you home from the hospital. If you will be going home right after the procedure, plan to have a responsible adult care for you for the time you are told. This is important. What happens during the procedure?   An IV will be inserted into one of your veins. You will be given one or more of the following: A medicine to help you relax (sedative). A medicine to numb the area (local anesthetic). A medicine to make you fall asleep (general anesthetic). A medicine that is injected into an area of your body to numb everything below the injection site (regional anesthetic). Your bladder may be emptied with a small tube (catheter). If you have been given a general anesthetic, a tube will be put down your throat to help you breathe. Two small incisions will be made  in your lower abdomen and near your belly button. Your abdomen will be inflated with a gas. This will let the surgeon see better and will give the surgeon room to  work. A lighted tube with camera (laparoscope) will be inserted into your abdomen through one of the incisions. Small instruments will be inserted through the other incision. The fallopian tubes will be tied off, burned (cauterized), or blocked with a clip, ring, or clamp. A small portion in the center of each fallopian tube may be removed. The gas will be released from the abdomen. The incisions will be closed with stitches (sutures). A bandage (dressing) will be placed over the incisions. The procedure may vary among health care providers and hospitals. What happens after the procedure? Your blood pressure, heart rate, breathing rate, and blood oxygen level will be monitored until you leave the hospital. You will be given medicine to help with pain, nausea, and vomiting as needed. You may have vaginal discharge after the procedure. You may need to wear a sanitary napkin. If you were given a sedative during the procedure, it can affect you for several hours. Do not drive or operate machinery until your health care provider says that it is safe. Summary Laparoscopic tubal ligation is a procedure that is done to prevent pregnancy. You should not have this procedure if you want to get pregnant someday or if you are unsure about having more children. The procedure is done using a thin, lighted tube (laparoscope) with a camera attached that will be inserted into your abdomen through an incision. After the procedure you will be given medicine to help with pain, nausea, and vomiting as needed. Plan to have someone take you home from the hospital. This information is not intended to replace advice given to you by your health care provider. Make sure you discuss any questions you have with your health care provider. Document Revised: 08/12/2020 Document Reviewed: 08/12/2020 Elsevier Patient Education  2022 [employer] Postpartum Baby Blues The postpartum period begins right after the birth of a  baby. During this time, there is often joy and excitement. It is also a time of many changes in the life of the parents. A mother may feel happy one minute and sad or stressed the next. These feelings of sadness, called the baby blues, usually happen in the period right after the baby is born and go away within a week or two. What are the causes? The exact cause of this condition is not known. Changes in hormone levels after childbirth are believed to trigger some of the symptoms. Other factors that can play a role in these mood changes include: Lack of sleep. Stressful life events, such as financial problems, caring for a loved one, or death of a loved one. Genetics. What are the signs or symptoms? Symptoms of this condition include: Changes in mood, such as going from extreme happiness to sadness. A decrease in concentration. Difficulty sleeping. Crying spells and tearfulness. Loss of appetite. Irritability. Anxiety. If these symptoms last for more than 2 weeks or become more severe, you may have postpartum depression. How is this diagnosed? This condition is diagnosed based on an evaluation of your symptoms. Your health care provider may use a screening tool that includes a list of questions to help identify a person with the baby blues or postpartum depression. How is this treated? The baby blues usually go away on their own in 1-2 weeks. Social support is often what is needed. You will be  encouraged to get adequate sleep and rest. Follow these instructions at home: Lifestyle   Get as much rest as you can. Take a nap when the baby sleeps. Exercise regularly as told by your health care provider. Some women find yoga and walking to be helpful. Eat a balanced and nourishing diet. This includes plenty of fruits and vegetables, whole grains, and lean proteins. Do little things that you enjoy. Take a bubble bath, read your favorite magazine, or listen to your favorite music. Avoid  alcohol. Ask for help with household chores, cooking, grocery shopping, or running errands. Do not try to do everything yourself. Consider hiring a postpartum doula to help. This is a professional who specializes in providing support to new mothers. Try not to make any major life changes during pregnancy or right after giving birth. This can add stress. General instructions Talk to people close to you about how you are feeling. Get support from your partner, family members, friends, or other new moms. You may want to join a support group. Find ways to manage stress. This may include: Writing your thoughts and feelings in a journal. Spending time outside. Spending time with people who make you laugh. Try to stay positive in how you think. Think about the things you are grateful for. Take over-the-counter and prescription medicines only as told by your health care provider. Let your health care provider know if you have any concerns. Keep all postpartum visits. This is important. Contact a health care provider if: Your baby blues do not go away after 2 weeks. Get help right away if: You have thoughts of taking your own life (suicidal thoughts), or of harming your baby or someone else. You see or hear things that are not there (hallucinations). If you ever feel like you may hurt yourself or others, or have thoughts about taking your own life, get help right away. Go to your nearest emergency department or: Call your local emergency services (911 in the [place] Call a suicide crisis helpline, such as the National Suicide Prevention Lifeline, at 640-477-0786. This is open 24 hours a day in the [place] Text the Crisis Text Line at 858-609-8099 (in the [place] Summary After giving birth, you may feel happy one minute and sad or stressed the next. Feelings of sadness that happen right after the baby is born and go away after a week or two are called the baby blues. You can manage the baby blues by getting  enough rest, eating a healthy diet, exercising, spending time with supportive people, and finding ways to manage stress. If feelings of sadness and stress last longer than 2 weeks or get in the way of caring for your baby, talk with your health care provider. This may mean you have postpartum depression. This information is not intended to replace advice given to you by your health care provider. Make sure you discuss any questions you have with your health care provider. Document Revised: 05/20/2020 Document Reviewed: 05/20/2020 Elsevier Patient Education  2022 [employer].

## 2021-10-02 NOTE — Progress Notes (Signed)
Postpartum Visit  Chief Complaint:  Chief Complaint  Patient presents with   Postpartum Care    History of Present Illness: Patient is a 35 y.o. [id number] presents for postpartum visit.  Date of delivery: 08/17/2021 Type of delivery: Vaginal Laceration: none  Pregnancy or labor problems: Denies  Breast Feeding:  no Lochia:  normal, return to normal menses this week.  Post partum depression/anxiety noted:  yes- she reports that she feels that her mood is related to financial situation and a desire to return to work. [place] Post-Partum Depression Score: 11  Date of last PAP: 2022 NIL   Any problems since the delivery:  no  She reports she has been feeling well.   Newborn Details:  SINGLETON :  1. Infant Status: Infant doing well at home with mother.   Review of Systems: Review of Systems  Constitutional:  Negative for chills, fever, malaise/fatigue and weight loss.  HENT:  Negative for congestion, hearing loss and sinus pain.   Eyes:  Negative for blurred vision and double vision.  Respiratory:  Negative for cough, sputum production, shortness of breath and wheezing.   Cardiovascular:  Negative for chest pain, palpitations, orthopnea and leg swelling.  Gastrointestinal:  Negative for abdominal pain, constipation, diarrhea, nausea and vomiting.  Genitourinary:  Negative for dysuria, flank pain, frequency, hematuria and urgency.  Musculoskeletal:  Negative for back pain, falls and joint pain.  Skin:  Negative for itching and rash.  Neurological:  Negative for dizziness and headaches.  Psychiatric/Behavioral:  Negative for depression, substance abuse and suicidal ideas. The patient is not nervous/anxious.    Past Medical History:  Past Medical History:  Diagnosis Date   ASCUS with positive high risk HPV cervical    Chlamydia 2017    Past Surgical History:  Past Surgical History:  Procedure Laterality Date   APPENDECTOMY     COLPOSCOPY      Family History:   Family History  Problem Relation Age of Onset   Prostate cancer Father    Seizures Father    Diabetes Mother    Breast cancer Neg Hx    Ovarian cancer Neg Hx    Colon cancer Neg Hx     Social History:  Social History   Socioeconomic History   Marital status: Married    Spouse name: Shandi Godfrey   Number of children: Not on file   Years of education: Not on file   Highest education level: Not on file  Occupational History   Not on file  Tobacco Use   Smoking status: Never   Smokeless tobacco: Never  Vaping Use   Vaping Use: Never used  Substance and Sexual Activity   Alcohol use: Not Currently   Drug use: Not Currently   Sexual activity: Yes    Birth control/protection: None  Other Topics Concern   Not on file  Social History Narrative   ** Merged History Encounter **       Social Determinants of Health   Financial Resource Strain: Not on file  Food Insecurity: Not on file  Transportation Needs: Not on file  Physical Activity: Not on file  Stress: Not on file  Social Connections: Not on file  Intimate Partner Violence: Not on file    Allergies:  No Known Allergies  Medications: Prior to Admission medications   Medication Sig Start Date End Date Taking? Authorizing Provider  Prenatal Vit-Fe Fumarate-FA (PNV PRENATAL PLUS MULTIVITAMIN) 27-1 MG TABS Take 1 tablet by mouth daily. 01/04/21 01/04/22  Yes [provider]  docusate sodium (COLACE) 100 MG capsule Take 1 capsule (100 mg total) by mouth 2 (two) times daily as needed. Patient not taking: Reported on 07/20/2021 06/14/21   Natale Milch, MD    Physical Exam Vitals:  Vitals:   10/02/21 1514  BP: 128/70    Physical Exam Constitutional:      Appearance: Normal appearance. She is well-developed.  Genitourinary:     Genitourinary Comments: Normal vulva   HENT:     Head: Normocephalic and atraumatic.  Eyes:     Extraocular Movements: Extraocular movements intact.     Pupils: Pupils  are equal, round, and reactive to light.  Neck:     Thyroid: No thyromegaly.  Cardiovascular:     Rate and Rhythm: Normal rate and regular rhythm.     Heart sounds: Normal heart sounds.  Pulmonary:     Effort: Pulmonary effort is normal.     Breath sounds: Normal breath sounds.  Abdominal:     General: Bowel sounds are normal. There is no distension.     Palpations: Abdomen is soft. There is no mass.  Musculoskeletal:     Cervical back: Neck supple.  Neurological:     Mental Status: She is alert and oriented to person, place, and time.  Skin:    General: Skin is warm and dry.  Psychiatric:        Behavior: Behavior normal.        Thought Content: Thought content normal.        Judgment: Judgment normal.  Vitals reviewed.    Assessment: 35 y.o. G3P3003 presenting for 6 week postpartum visit  Plan: Problem List Items Addressed This Visit   None Visit Diagnoses     Birth control counseling    -  Primary   Postpartum care and examination           1) Contraception-  OCP until tubal, tubal scheduling request sent  2)  Pap: up to date  3) Patient underwent screening for postpartum depression with some concerns noted. Patient declines treatment options at this time  - Follow up as needed   Adelene Idler MD, [facility], [facility] 10/02/2021 4:05 PM

## 2021-10-09 ENCOUNTER — Telehealth: Payer: Self-pay

## 2021-10-09 NOTE — Telephone Encounter (Signed)
Called patient to schedule Robotic laparoscopic salpingectomy w Dominica Severin 12/13  H&P 12/1 @ 8:50   Pre-admit phone call appointment to be requested - date and time will be included on H&P paper work. Also all appointments will be updated on pt MyChart. Explained that this appointment has a call window. Based on the time scheduled will indicate if the call will be received within a 4 hour window before 1:00 or after.  Advised that pt may also receive calls from the hospital pharmacy and pre-service center.  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 Medicaid FP secondary insurance.   Consent has been signed.

## 2021-10-09 NOTE — Telephone Encounter (Signed)
-----   Message from Natale Milch, MD sent at 10/02/2021  4:00 PM EDT ----- Patient may want tubal before Nov. 1st when insurance lapses   Surgery Booking Request Patient Full Name:  Brenda Dodson  MRN: [id number]  DOB: 09-Apr-1986  Surgeon: Natale Milch, MD  Requested Surgery Date and Time: when patient desires Primary Diagnosis AND Code: Desires sterilization Secondary Diagnosis and Code:  Surgical Procedure: Robotic Laparoscopic salpingectomy RNFA Requested?: Yes L&D Notification: No Admission Status: same day surgery Length of Surgery: 50 min Special Case Needs: No H&P: yes- if there is time before procedure Phone Interview???:  Yes Interpreter: No Medical Clearance:  No Special Scheduling Instructions: No Any known health/anesthesia issues, diabetes, sleep apnea, latex allergy, defibrillator/pacemaker?: No Acuity: P3   (P1 highest, P2 delay may cause harm, P3 low, elective gyn, P4 lowest) Post op follow up visits: 1 week postpartum

## 2021-10-16 ENCOUNTER — Other Ambulatory Visit: Payer: Self-pay

## 2021-11-01 IMAGING — CR DG CHEST 2V
1 series · 2 of 2 positions shown · non-contrast
Comparison: None.

CLINICAL DATA: Cough and shortness of breath.

EXAM:
CHEST - 2 VIEW

[Series 1: dg chest 2 view · 0.14mm/px · 2 of 2 slices shown]
[im 1/2]
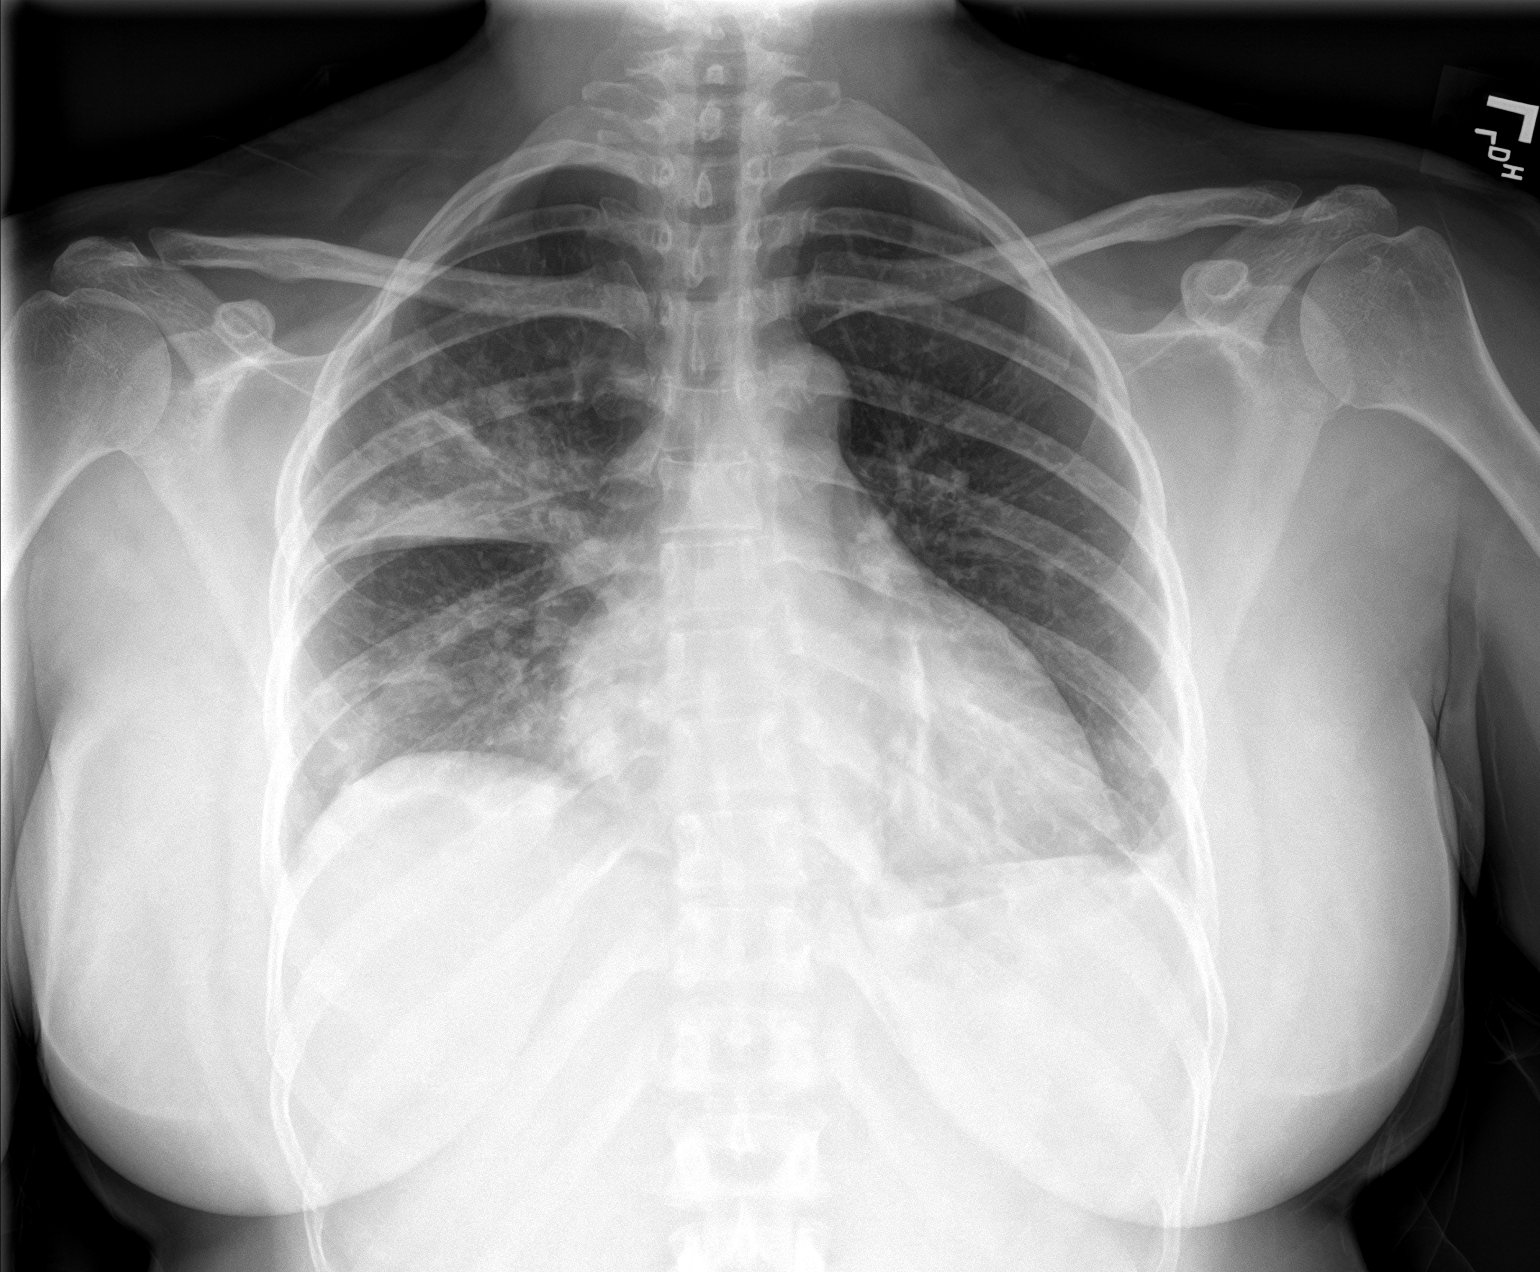
[im 2/2]
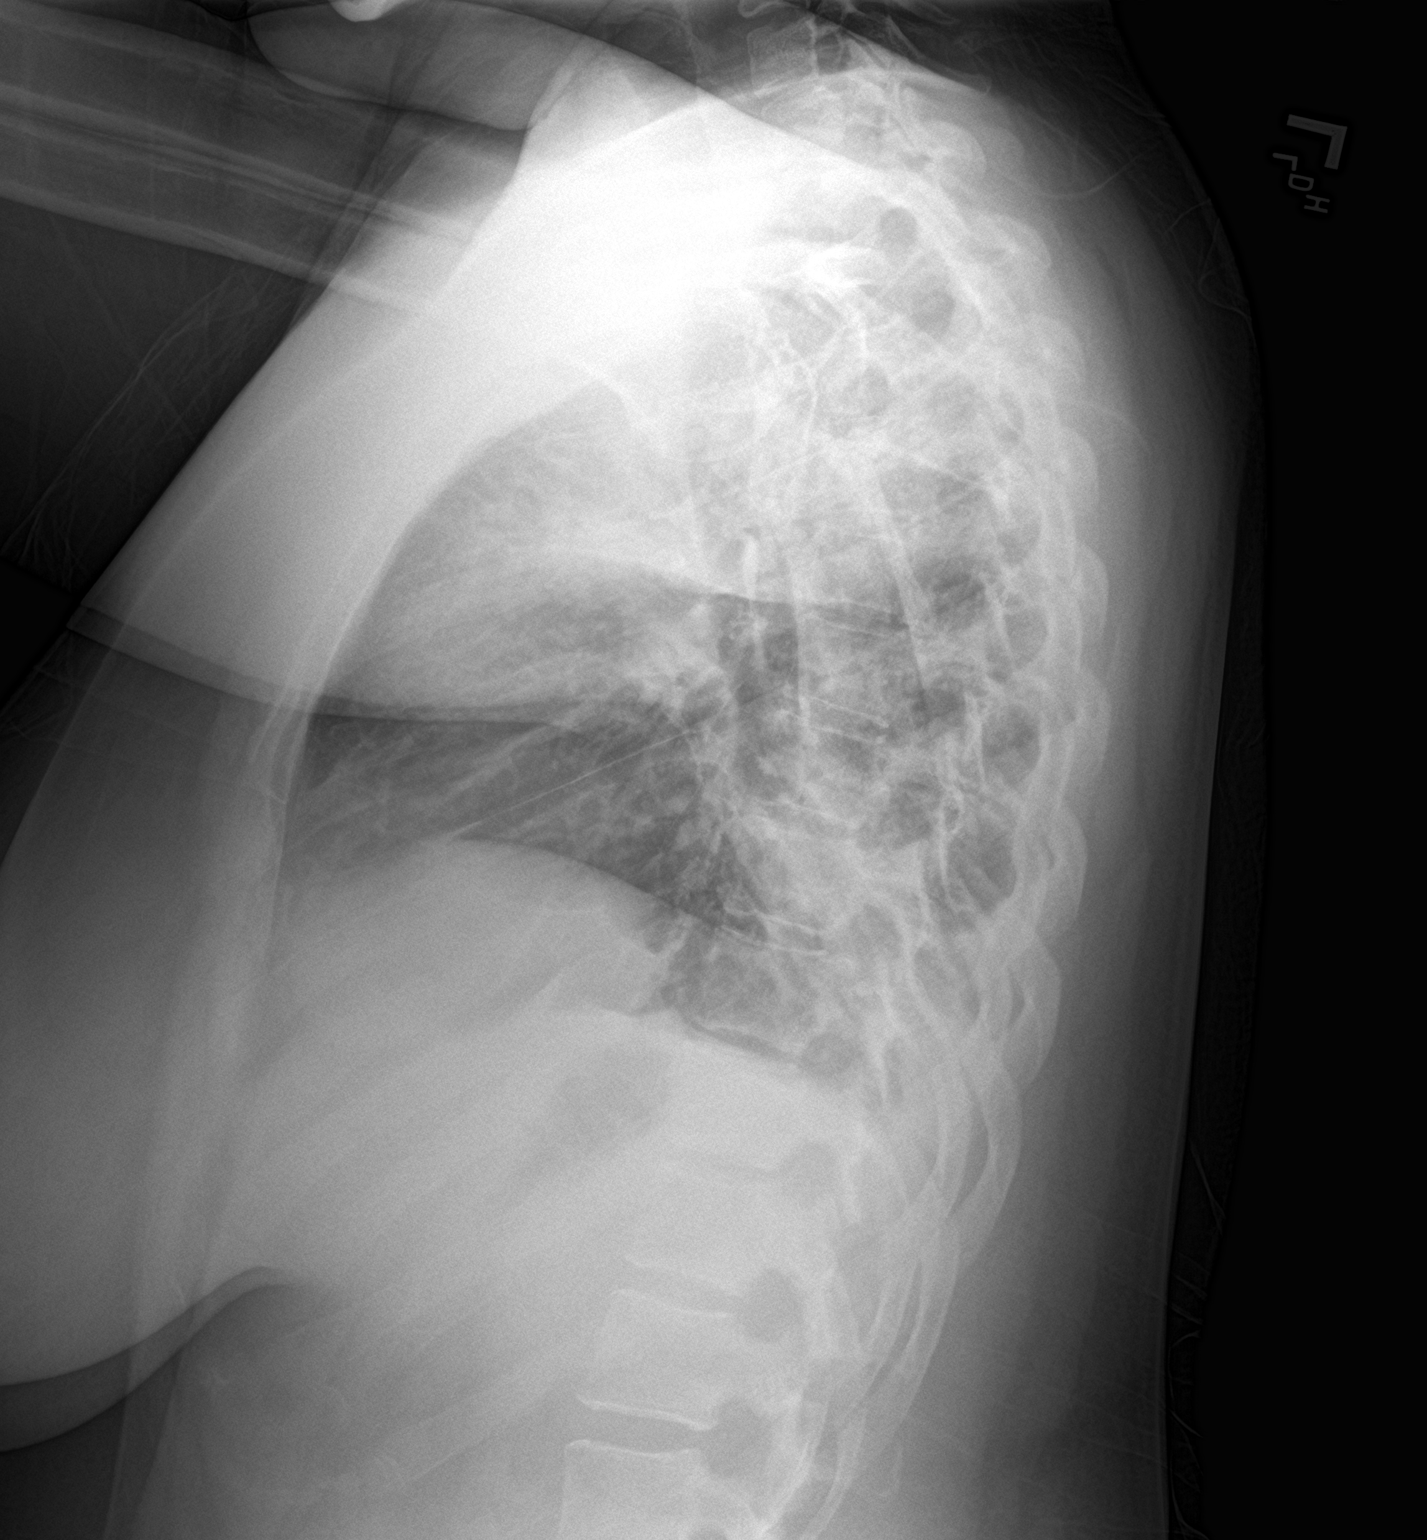

[2 of 2 positions shown; findings below may reference images not displayed]

FINDINGS: The mediastinal contour and cardiac silhouette are normal.
Consolidation of the right upper lobe, right lung base, medial left
lung base are identified. There is no pulmonary edema. The bony
structures are normal.
IMPRESSION: Bilateral pneumonias.

## 2021-11-09 ENCOUNTER — Encounter: Payer: PRIVATE HEALTH INSURANCE | Admitting: Obstetrics and Gynecology

## 2021-11-10 ENCOUNTER — Other Ambulatory Visit: Payer: PRIVATE HEALTH INSURANCE

## 2021-11-21 ENCOUNTER — Ambulatory Visit: Admit: 2021-11-21 | Payer: Medicaid Other | Admitting: Obstetrics and Gynecology

## 2021-11-21 SURGERY — SALPINGECTOMY, ROBOT-ASSISTED
Anesthesia: Choice | Laterality: Bilateral

## 2021-12-21 NOTE — Telephone Encounter (Signed)
Patient is calling to reset up cancelled surgery. Please advise

## 2022-01-01 ENCOUNTER — Telehealth: Payer: Self-pay

## 2022-01-01 NOTE — Telephone Encounter (Signed)
Orders placed.

## 2022-01-01 NOTE — Telephone Encounter (Signed)
Called patient to re-schedule xi laparoscopic salpingectomy w Dominica Severin 2/7  H&P 1/30 @  4:15  Pre-admit phone call appointment to be requested - date and time will be included on H&P paper work. Also all appointments will be updated on pt MyChart. Explained that this appointment has a call window. Based on the time scheduled will indicate if the call will be received within a 4 hour window before 1:00 or after.  Advised that pt may also receive calls from the hospital pharmacy and pre-service center.  Confirmed pt has [employer] as Editor, commissioning. Medicaid [employer] as secondary insurance.   [employer] ins card needed

## 2022-01-08 ENCOUNTER — Other Ambulatory Visit: Payer: Self-pay

## 2022-01-08 ENCOUNTER — Ambulatory Visit (INDEPENDENT_AMBULATORY_CARE_PROVIDER_SITE_OTHER): Payer: Medicaid Other | Admitting: Obstetrics and Gynecology

## 2022-01-08 VITALS — BP 115/70 | Ht <= 58 in | Wt 183.0 lb

## 2022-01-08 DIAGNOSIS — Z3009 Encounter for other general counseling and advice on contraception: Secondary | ICD-10-CM | POA: Diagnosis not present

## 2022-01-08 NOTE — Progress Notes (Signed)
Patient ID: Brenda Dodson, female   DOB: 1986/03/12, 36 y.o.   MRN: [id number]  Reason for Consult: Pre-op Exam   Referred by Will Bonnet, MD  Subjective:     HPI:  Brenda Dodson is a 36 y.o. female. She reports that she desires sterilization.   Gynecological History  Patient's last menstrual period was 01/02/2022.  Past Medical History:  Diagnosis Date   ASCUS with positive high risk HPV cervical    Chlamydia 2017   Family History  Problem Relation Age of Onset   Prostate cancer Father    Seizures Father    Diabetes Mother    Breast cancer Neg Hx    Ovarian cancer Neg Hx    Colon cancer Neg Hx    Past Surgical History:  Procedure Laterality Date   APPENDECTOMY     COLPOSCOPY      Short Social History:  Social History   Tobacco Use   Smoking status: Never   Smokeless tobacco: Never  Substance Use Topics   Alcohol use: Not Currently    No Known Allergies  No current outpatient medications on file.   No current facility-administered medications for this visit.    Review of Systems  Constitutional: Negative for chills, fatigue, fever and unexpected weight change.  HENT: Negative for trouble swallowing.  Eyes: Negative for loss of vision.  Respiratory: Negative for cough, shortness of breath and wheezing.  Cardiovascular: Negative for chest pain, leg swelling, palpitations and syncope.  GI: Negative for abdominal pain, blood in stool, diarrhea, nausea and vomiting.  GU: Negative for difficulty urinating, dysuria, frequency and hematuria.  Musculoskeletal: Negative for back pain, leg pain and joint pain.  Skin: Negative for rash.  Neurological: Negative for dizziness, headaches, light-headedness, numbness and seizures.  Psychiatric: Negative for behavioral problem, confusion, depressed mood and sleep disturbance.       Objective:  Objective   Vitals:   01/08/22 1629  BP: 115/70  Weight: 183 lb (83 kg)  Height: 4\' 9"  (1.448 m)   Body mass  index is 39.6 kg/m.  Physical Exam Vitals and nursing note reviewed. Exam conducted with a chaperone present.  Constitutional:      Appearance: Normal appearance.  HENT:     Head: Normocephalic and atraumatic.  Eyes:     Extraocular Movements: Extraocular movements intact.     Pupils: Pupils are equal, round, and reactive to light.  Cardiovascular:     Rate and Rhythm: Normal rate and regular rhythm.  Pulmonary:     Effort: Pulmonary effort is normal.     Breath sounds: Normal breath sounds.  Abdominal:     General: Abdomen is flat.     Palpations: Abdomen is soft.  Musculoskeletal:     Cervical back: Normal range of motion.  Skin:    General: Skin is warm and dry.  Neurological:     General: No focal deficit present.     Mental Status: She is alert and oriented to person, place, and time.  Psychiatric:        Behavior: Behavior normal.        Thought Content: Thought content normal.        Judgment: Judgment normal.    Assessment/Plan:     36 y.o. [id number]  Patient reports that she desires sterilization. She feels strongly that she does not desire children in the future.   We discussed alternative options for sterilization including long-acting reversible contraception methods. We discussed that sterilization procedures have a  failure rate of approximately 1 in  1000.  We discussed potential surgical complications of sterile sterilization including infection damage to surrounding pelvic tissues and risk of bleeding.We discussed that sterilization procedure should be considered on reversible.  Having a reversal surgery to correct a tubal ligation is expensive, risks and ectopic pregnancy, and has high failure rates.  We discussed options of performing a tubal ligation including removing a segment of the fallopian tube or removing an entire fallopian tube.  We discussed that removal of entire fallopian tube has been associated with a reduction in ovarian cancer risk, however  this reduction in is small in her lifetime risk of ovarian cancer is approximately 1 in 100.   Patient feels sure of her decision to have a sterilization procedure.  She desires to have a laparoscopic bilateral salpingectomy.    Adrian Prows MD, [facility], [facility] 01/08/2022 4:32 PM

## 2022-01-08 NOTE — H&P (View-Only) (Signed)
° °Patient ID: Brenda Dodson, female   DOB: 10/19/1986, 35 y.o.   MRN: [id number] ° °Reason for Consult: Pre-op Exam °  °Referred by Jackson, Stephen D, MD ° °Subjective:  °   °HPI: ° °Brenda Dodson is a 35 y.o. female. She reports that she desires sterilization.  ° °Gynecological History ° °Patient's last menstrual period was 01/02/2022. ° °Past Medical History:  °Diagnosis Date  ° ASCUS with positive high risk HPV cervical   ° Chlamydia 2017  ° °Family History  °Problem Relation Age of Onset  ° Prostate cancer Father   ° Seizures Father   ° Diabetes Mother   ° Breast cancer Neg Hx   ° Ovarian cancer Neg Hx   ° Colon cancer Neg Hx   ° °Past Surgical History:  °Procedure Laterality Date  ° APPENDECTOMY    ° COLPOSCOPY    ° ° °Short Social History:  °Social History  ° °Tobacco Use  ° Smoking status: Never  ° Smokeless tobacco: Never  °Substance Use Topics  ° Alcohol use: Not Currently  ° ° °No Known Allergies ° °No current outpatient medications on file.  ° °No current facility-administered medications for this visit.  ° ° °Review of Systems  °Constitutional: Negative for chills, fatigue, fever and unexpected weight change.  °HENT: Negative for trouble swallowing.  °Eyes: Negative for loss of vision.  °Respiratory: Negative for cough, shortness of breath and wheezing.  °Cardiovascular: Negative for chest pain, leg swelling, palpitations and syncope.  °GI: Negative for abdominal pain, blood in stool, diarrhea, nausea and vomiting.  °GU: Negative for difficulty urinating, dysuria, frequency and hematuria.  °Musculoskeletal: Negative for back pain, leg pain and joint pain.  °Skin: Negative for rash.  °Neurological: Negative for dizziness, headaches, light-headedness, numbness and seizures.  °Psychiatric: Negative for behavioral problem, confusion, depressed mood and sleep disturbance.   ° °   °Objective:  °Objective  ° °Vitals:  ° 01/08/22 1629  °BP: 115/70  °Weight: 183 lb (83 kg)  °Height: 4' 9" (1.448 m)  ° °Body mass  index is 39.6 kg/m². ° °Physical Exam °Vitals and nursing note reviewed. Exam conducted with a chaperone present.  °Constitutional:   °   Appearance: Normal appearance.  °HENT:  °   Head: Normocephalic and atraumatic.  °Eyes:  °   Extraocular Movements: Extraocular movements intact.  °   Pupils: Pupils are equal, round, and reactive to light.  °Cardiovascular:  °   Rate and Rhythm: Normal rate and regular rhythm.  °Pulmonary:  °   Effort: Pulmonary effort is normal.  °   Breath sounds: Normal breath sounds.  °Abdominal:  °   General: Abdomen is flat.  °   Palpations: Abdomen is soft.  °Musculoskeletal:  °   Cervical back: Normal range of motion.  °Skin: °   General: Skin is warm and dry.  °Neurological:  °   General: No focal deficit present.  °   Mental Status: She is alert and oriented to person, place, and time.  °Psychiatric:     °   Behavior: Behavior normal.     °   Thought Content: Thought content normal.     °   Judgment: Judgment normal.  ° ° °Assessment/Plan:  °  ° °35 y.o. G3P3003 ° °Patient reports that she desires sterilization. She feels strongly that she does not desire children in the future.   We discussed alternative options for sterilization including long-acting reversible contraception methods. We discussed that sterilization procedures have a   failure rate of approximately 1 in  1000.  We discussed potential surgical complications of sterile sterilization including infection damage to surrounding pelvic tissues and risk of bleeding.We discussed that sterilization procedure should be considered on reversible.  Having a reversal surgery to correct a tubal ligation is expensive, risks and ectopic pregnancy, and has high failure rates.  We discussed options of performing a tubal ligation including removing a segment of the fallopian tube or removing an entire fallopian tube.  We discussed that removal of entire fallopian tube has been associated with a reduction in ovarian cancer risk, however  this reduction in is small in her lifetime risk of ovarian cancer is approximately 1 in 100.   Patient feels sure of her decision to have a sterilization procedure.  She desires to have a laparoscopic bilateral salpingectomy.    Adrian Prows MD, [facility], [facility] 01/08/2022 4:32 PM

## 2022-01-10 ENCOUNTER — Other Ambulatory Visit: Payer: Self-pay

## 2022-01-10 ENCOUNTER — Other Ambulatory Visit
Admission: RE | Admit: 2022-01-10 | Discharge: 2022-01-10 | Disposition: A | Discharge status: Home / Self Care | Payer: Medicaid Other | Source: Ambulatory Visit | Attending: Obstetrics and Gynecology | Admitting: Obstetrics and Gynecology

## 2022-01-10 HISTORY — DX: Pneumonia, unspecified organism: J18.9

## 2022-01-10 NOTE — Patient Instructions (Addendum)
Your procedure is scheduled on: 01/16/22 - Tuesday Report to the Registration Desk on the 1st floor of the [facility]. To find out your arrival time, please call [phone] between 1PM - 3PM on: 01/15/22 - Monday  Report To [facility] for Labs on 01/11/22 at 10 am.  REMEMBER: Instructions that are not followed completely may result in serious medical risk, up to and including death; or upon the discretion of your surgeon and anesthesiologist your surgery may need to be rescheduled.  Do not eat food after midnight the night before surgery.  No gum chewing, lozengers or hard candies.  You may however, drink CLEAR liquids up to 2 hours before you are scheduled to arrive for your surgery. Do not drink anything within 2 hours of your scheduled arrival time.  Clear liquids include: - water  - apple juice without pulp - gatorade (not RED, PURPLE, OR BLUE) - black coffee or tea (Do NOT add milk or creamers to the coffee or tea) Do NOT drink anything that is not on this list.   In addition, your doctor has ordered for you to drink the provided  Ensure Pre-Surgery Clear Carbohydrate Drink  Drinking this carbohydrate drink up to two hours before surgery helps to reduce insulin resistance and improve patient outcomes. Please complete drinking 2 hours prior to scheduled arrival time.  TAKE THESE MEDICATIONS THE MORNING OF SURGERY WITH A SIP OF WATER: NONE  One week prior to surgery: Stop Anti-inflammatories (NSAIDS) such as Advil, Aleve, Ibuprofen, Motrin, Naproxen, Naprosyn and Aspirin based products such as Excedrin, Goodys Powder, BC Powder.  Stop ANY OVER THE COUNTER supplements until after surgery.  You may however, continue to take Tylenol if needed for pain up until the day of surgery.  No Alcohol for 24 hours before or after surgery.  No Smoking including e-cigarettes for 24 hours prior to surgery.  No chewable tobacco products for at least 6 hours prior to surgery.   No nicotine patches on the day of surgery.  Do not use any "recreational" drugs for at least a week prior to your surgery.  Please be advised that the combination of cocaine and anesthesia may have negative outcomes, up to and including death. If you test positive for cocaine, your surgery will be cancelled.  On the morning of surgery brush your teeth with toothpaste and water, you may rinse your mouth with mouthwash if you wish. Do not swallow any toothpaste or mouthwash.  Use CHG Soap or wipes as directed on instruction sheet.  Do not wear jewelry, make-up, hairpins, clips or nail polish.  Do not wear lotions, powders, or perfumes.   Do not shave body from the neck down 48 hours prior to surgery just in case you cut yourself which could leave a site for infection.  Also, freshly shaved skin may become irritated if using the CHG soap.  Contact lenses, hearing aids and dentures may not be worn into surgery.  Do not bring valuables to the hospital. [facility] is not responsible for any missing/lost belongings or valuables.   Notify your doctor if there is any change in your medical condition (cold, fever, infection).  Wear comfortable clothing (specific to your surgery type) to the hospital.  After surgery, you can help prevent lung complications by doing breathing exercises.  Take deep breaths and cough every 1-2 hours. Your doctor may order a device called an Incentive Spirometer to help you take deep breaths. When coughing or sneezing, hold a  pillow firmly against your incision with both hands. This is called splinting. Doing this helps protect your incision. It also decreases belly discomfort.  If you are being admitted to the hospital overnight, leave your suitcase in the car. After surgery it may be brought to your room.  If you are being discharged the day of surgery, you will not be allowed to drive home. You will need a responsible adult (18 years or older) to drive you  home and stay with you that night.   If you are taking public transportation, you will need to have a responsible adult (18 years or older) with you. Please confirm with your physician that it is acceptable to use public transportation.   Please call the [place] [facility] at [phone] if you have any questions about these instructions.  Surgery Visitation Policy:  Patients undergoing a surgery or procedure may have one family member or support person with them as long as that person is not COVID-19 positive or experiencing its symptoms.  That person may remain in the waiting area during the procedure and may rotate out with other people.  Inpatient Visitation:    Visiting hours are 7 a.m. to 8 p.m. Up to two visitors ages 16+ are allowed at one time in a patient room. The visitors may rotate out with other people during the day. Visitors must check out when they leave, or other visitors will not be allowed. One designated support person may remain overnight. The visitor must pass COVID-19 screenings, use hand sanitizer when entering and exiting the patients room and wear a mask at all times, including in the patients room. Patients must also wear a mask when staff or their visitor are in the room. Masking is required regardless of vaccination status.

## 2022-01-11 ENCOUNTER — Other Ambulatory Visit
Admission: RE | Admit: 2022-01-11 | Discharge: 2022-01-11 | Disposition: A | Discharge status: Home / Self Care | Payer: BC Managed Care – PPO | Source: Ambulatory Visit | Attending: Obstetrics and Gynecology | Admitting: Obstetrics and Gynecology

## 2022-01-11 ENCOUNTER — Encounter: Payer: Self-pay | Admitting: Urgent Care

## 2022-01-11 DIAGNOSIS — Z01812 Encounter for preprocedural laboratory examination: Secondary | ICD-10-CM | POA: Insufficient documentation

## 2022-01-11 DIAGNOSIS — Z3009 Encounter for other general counseling and advice on contraception: Secondary | ICD-10-CM

## 2022-01-11 LAB — TYPE AND SCREEN
ABO/RH(D): B POS
Antibody Screen: NEGATIVE

## 2022-01-11 LAB — CBC
HCT: 37.3 % (ref 36.0–46.0)
Hemoglobin: 11.3 g/dL — ABNORMAL LOW (ref 12.0–15.0)
MCH: 24.7 pg — ABNORMAL LOW (ref 26.0–34.0)
MCHC: 30.3 g/dL (ref 30.0–36.0)
MCV: 81.4 fL (ref 80.0–100.0)
Platelets: 308 10*3/uL (ref 150–400)
RBC: 4.58 MIL/uL (ref 3.87–5.11)
RDW: 17.3 % — ABNORMAL HIGH (ref 11.5–15.5)
WBC: 5.9 10*3/uL (ref 4.0–10.5)
nRBC: 0 % (ref 0.0–0.2)

## 2022-01-15 NOTE — Anesthesia Preprocedure Evaluation (Addendum)
Anesthesia Evaluation  Patient identified by MRN, date of birth, ID band Patient awake    Reviewed: Allergy & Precautions, NPO status , Patient's Chart, lab work & pertinent test results  Airway Mallampati: III  TM Distance: >3 FB Neck ROM: full    Dental  (+) Chipped   Pulmonary neg pulmonary ROS,    Pulmonary exam normal        Cardiovascular negative cardio ROS Normal cardiovascular exam  ECG 09/16/21:  Normal sinus rhythm Nonspecific T wave abnormality   Neuro/Psych negative neurological ROS  negative psych ROS   GI/Hepatic negative GI ROS, Neg liver ROS,   Endo/Other  negative endocrine ROSObesity   Renal/GU      Musculoskeletal   Abdominal   Peds  Hematology  (+) Blood dyscrasia, anemia ,   Anesthesia Other Findings Past Medical History: No date: ASCUS with positive high risk HPV cervical 2017: Chlamydia No date: Pneumonia  Past Surgical History: No date: APPENDECTOMY No date: COLPOSCOPY     Reproductive/Obstetrics negative OB ROS                            Anesthesia Physical Anesthesia Plan  ASA: 2  Anesthesia Plan: General   Post-op Pain Management: Tylenol PO (pre-op), Celebrex PO (pre-op) and Gabapentin PO (pre-op)   Induction: Intravenous  PONV Risk Score and Plan: 3 and Ondansetron, Dexamethasone and Treatment may vary due to age or medical condition  Airway Management Planned: Oral ETT  Additional Equipment:   Intra-op Plan:   Post-operative Plan: Extubation in OR  Informed Consent:   Plan Discussed with: CRNA  Anesthesia Plan Comments: (Patient consented for risks of anesthesia including but not limited to:  - adverse reactions to medications - damage to eyes, teeth, lips or other oral mucosa - nerve damage due to positioning  - sore throat or hoarseness - damage to heart, brain, nerves, lungs, other parts of body or loss of life  Informed  patient about role of CRNA in peri- and intra-operative care.  Patient voiced understanding.)       Anesthesia Quick Evaluation

## 2022-01-16 ENCOUNTER — Ambulatory Visit
Admission: RE | Admit: 2022-01-16 | Discharge: 2022-01-16 | Disposition: A | Discharge status: Home / Self Care | Payer: BC Managed Care – PPO | Source: Ambulatory Visit | Attending: Obstetrics and Gynecology | Admitting: Obstetrics and Gynecology

## 2022-01-16 ENCOUNTER — Encounter
Admission: RE | Disposition: A | Discharge status: Home / Self Care | Payer: Self-pay | Source: Ambulatory Visit | Attending: Obstetrics and Gynecology

## 2022-01-16 ENCOUNTER — Other Ambulatory Visit: Payer: Self-pay

## 2022-01-16 ENCOUNTER — Ambulatory Visit: Payer: BC Managed Care – PPO | Admitting: Anesthesiology

## 2022-01-16 ENCOUNTER — Encounter: Payer: Self-pay | Admitting: Obstetrics and Gynecology

## 2022-01-16 DIAGNOSIS — Z3009 Encounter for other general counseling and advice on contraception: Secondary | ICD-10-CM

## 2022-01-16 DIAGNOSIS — Z302 Encounter for sterilization: Secondary | ICD-10-CM | POA: Diagnosis not present

## 2022-01-16 HISTORY — PX: XI ROBOTIC ASSISTED SALPINGECTOMY: SHX6824

## 2022-01-16 LAB — POCT PREGNANCY, URINE: Preg Test, Ur: NEGATIVE

## 2022-01-16 SURGERY — SALPINGECTOMY, ROBOT-ASSISTED
Anesthesia: General | Laterality: Bilateral

## 2022-01-16 MED ORDER — CELECOXIB 200 MG PO CAPS: 200.0000 mg | ORAL_CAPSULE | Freq: Once | ORAL | Status: AC

## 2022-01-16 MED ORDER — LIDOCAINE HCL (CARDIAC) PF 100 MG/5ML IV SOSY
PREFILLED_SYRINGE | INTRAVENOUS | Status: DC | PRN
  Administered 2022-01-16: 100 mg via INTRAVENOUS

## 2022-01-16 MED ORDER — CHLORHEXIDINE GLUCONATE 0.12 % MT SOLN: 15.0000 mL | Freq: Once | OROMUCOSAL | Status: AC

## 2022-01-16 MED ORDER — 0.9 % SODIUM CHLORIDE (POUR BTL) OPTIME
TOPICAL | Status: DC | PRN
  Administered 2022-01-16: 100 mL

## 2022-01-16 MED ORDER — ONDANSETRON HCL 4 MG/2ML IJ SOLN
INTRAMUSCULAR | Status: DC | PRN
  Administered 2022-01-16: 4 mg via INTRAVENOUS

## 2022-01-16 MED ORDER — GABAPENTIN 600 MG PO TABS
300.0000 mg | ORAL_TABLET | Freq: Once | ORAL | Status: AC
  Administered 2022-01-16: 300 mg via ORAL
  Filled 2022-01-16: qty 0.5

## 2022-01-16 MED ORDER — IBUPROFEN 600 MG PO TABS: 600.0000 mg | ORAL_TABLET | Freq: Four times a day (QID) | ORAL | 0 refills | Status: DC | PRN

## 2022-01-16 MED ORDER — ONDANSETRON HCL 4 MG/2ML IJ SOLN: 4.0000 mg | Freq: Once | INTRAMUSCULAR | Status: DC | PRN

## 2022-01-16 MED ORDER — LACTATED RINGERS IV SOLN: INTRAVENOUS | Status: DC

## 2022-01-16 MED ORDER — PROPOFOL 10 MG/ML IV BOLUS
INTRAVENOUS | Status: DC | PRN
  Administered 2022-01-16: 150 mg via INTRAVENOUS

## 2022-01-16 MED ORDER — ACETAMINOPHEN 500 MG PO TABS
ORAL_TABLET | ORAL | Status: AC
  Administered 2022-01-16: 1000 mg via ORAL
  Filled 2022-01-16: qty 2

## 2022-01-16 MED ORDER — OXYCODONE HCL 5 MG PO TABS: 5.0000 mg | ORAL_TABLET | Freq: Three times a day (TID) | ORAL | 0 refills | Status: DC | PRN

## 2022-01-16 MED ORDER — FENTANYL CITRATE (PF) 100 MCG/2ML IJ SOLN
INTRAMUSCULAR | Status: DC | PRN
  Administered 2022-01-16 (×2): 25 ug via INTRAVENOUS
  Administered 2022-01-16: 50 ug via INTRAVENOUS

## 2022-01-16 MED ORDER — GABAPENTIN 300 MG PO CAPS
ORAL_CAPSULE | ORAL | Status: AC
  Filled 2022-01-16: qty 1

## 2022-01-16 MED ORDER — FAMOTIDINE 20 MG PO TABS: 20.0000 mg | ORAL_TABLET | Freq: Once | ORAL | Status: AC

## 2022-01-16 MED ORDER — OXYCODONE HCL 5 MG PO TABS: 5.0000 mg | ORAL_TABLET | Freq: Once | ORAL | Status: AC | PRN

## 2022-01-16 MED ORDER — ROCURONIUM BROMIDE 100 MG/10ML IV SOLN
INTRAVENOUS | Status: DC | PRN
  Administered 2022-01-16: 20 mg via INTRAVENOUS
  Administered 2022-01-16: 40 mg via INTRAVENOUS

## 2022-01-16 MED ORDER — FENTANYL CITRATE (PF) 100 MCG/2ML IJ SOLN: 25.0000 ug | INTRAMUSCULAR | Status: DC | PRN

## 2022-01-16 MED ORDER — CHLORHEXIDINE GLUCONATE 0.12 % MT SOLN
OROMUCOSAL | Status: AC
  Administered 2022-01-16: 15 mL via OROMUCOSAL
  Filled 2022-01-16: qty 15

## 2022-01-16 MED ORDER — BUPIVACAINE LIPOSOME 1.3 % IJ SUSP
INTRAMUSCULAR | Status: DC | PRN
  Administered 2022-01-16: 20 mL

## 2022-01-16 MED ORDER — FENTANYL CITRATE (PF) 100 MCG/2ML IJ SOLN
INTRAMUSCULAR | Status: AC
  Filled 2022-01-16: qty 2

## 2022-01-16 MED ORDER — DEXAMETHASONE SODIUM PHOSPHATE 10 MG/ML IJ SOLN
INTRAMUSCULAR | Status: DC | PRN
  Administered 2022-01-16: 10 mg via INTRAVENOUS

## 2022-01-16 MED ORDER — ACETAMINOPHEN 10 MG/ML IV SOLN: 1000.0000 mg | Freq: Once | INTRAVENOUS | Status: DC | PRN

## 2022-01-16 MED ORDER — BUPIVACAINE LIPOSOME 1.3 % IJ SUSP
INTRAMUSCULAR | Status: AC
  Filled 2022-01-16: qty 20

## 2022-01-16 MED ORDER — DEXMEDETOMIDINE (PRECEDEX) IN NS 20 MCG/5ML (4 MCG/ML) IV SYRINGE
PREFILLED_SYRINGE | INTRAVENOUS | Status: DC | PRN
  Administered 2022-01-16: 4 ug via INTRAVENOUS

## 2022-01-16 MED ORDER — MIDAZOLAM HCL 2 MG/2ML IJ SOLN
INTRAMUSCULAR | Status: AC
  Filled 2022-01-16: qty 2

## 2022-01-16 MED ORDER — ORAL CARE MOUTH RINSE: 15.0000 mL | Freq: Once | OROMUCOSAL | Status: AC

## 2022-01-16 MED ORDER — SUGAMMADEX SODIUM 200 MG/2ML IV SOLN
INTRAVENOUS | Status: DC | PRN
  Administered 2022-01-16: 200 mg via INTRAVENOUS

## 2022-01-16 MED ORDER — MIDAZOLAM HCL 2 MG/2ML IJ SOLN
INTRAMUSCULAR | Status: DC | PRN
  Administered 2022-01-16 (×2): 1 mg via INTRAVENOUS

## 2022-01-16 MED ORDER — CELECOXIB 200 MG PO CAPS
ORAL_CAPSULE | ORAL | Status: AC
  Administered 2022-01-16: 200 mg via ORAL
  Filled 2022-01-16: qty 1

## 2022-01-16 MED ORDER — FAMOTIDINE 20 MG PO TABS
ORAL_TABLET | ORAL | Status: AC
  Administered 2022-01-16: 20 mg via ORAL
  Filled 2022-01-16: qty 1

## 2022-01-16 MED ORDER — GLYCOPYRROLATE 0.2 MG/ML IJ SOLN
INTRAMUSCULAR | Status: DC | PRN
  Administered 2022-01-16: .2 mg via INTRAVENOUS

## 2022-01-16 MED ORDER — POVIDONE-IODINE 10 % EX SWAB: 2.0000 "application " | Freq: Once | CUTANEOUS | Status: DC

## 2022-01-16 MED ORDER — ACETAMINOPHEN 500 MG PO TABS: 1000.0000 mg | ORAL_TABLET | Freq: Once | ORAL | Status: AC

## 2022-01-16 MED ORDER — OXYCODONE HCL 5 MG PO TABS
ORAL_TABLET | ORAL | Status: AC
  Administered 2022-01-16: 5 mg via ORAL
  Filled 2022-01-16: qty 1

## 2022-01-16 MED ORDER — OXYCODONE HCL 5 MG/5ML PO SOLN: 5.0000 mg | Freq: Once | ORAL | Status: AC | PRN

## 2022-01-16 SURGICAL SUPPLY — 59 items
ADH SKN CLS APL DERMABOND .7 (GAUZE/BANDAGES/DRESSINGS) ×1
APL PRP STRL LF DISP 70% ISPRP (MISCELLANEOUS) ×1
APL SWBSTK 6 STRL LF DISP (MISCELLANEOUS) ×2
APPLICATOR COTTON TIP 6 STRL (MISCELLANEOUS) IMPLANT
APPLICATOR COTTON TIP 6IN STRL (MISCELLANEOUS) ×4
BACTOSHIELD CHG 4% 4OZ (MISCELLANEOUS) ×1
BAG DRN RND TRDRP ANRFLXCHMBR (UROLOGICAL SUPPLIES) ×1
BAG URINE DRAIN 2000ML AR STRL (UROLOGICAL SUPPLIES) ×2 IMPLANT
BASIN GRAD PLASTIC 32OZ STRL (MISCELLANEOUS) ×2 IMPLANT
BLADE SURG SZ11 CARB STEEL (BLADE) ×2 IMPLANT
CATH FOLEY SIL 2WAY 14FR5CC (CATHETERS) ×2 IMPLANT
CHLORAPREP W/TINT 26 (MISCELLANEOUS) ×2 IMPLANT
COVER MAYO STAND REUSABLE (DRAPES) ×2 IMPLANT
COVER WAND RF STERILE (DRAPES) ×2 IMPLANT
DERMABOND ADVANCED (GAUZE/BANDAGES/DRESSINGS) ×1
DERMABOND ADVANCED .7 DNX12 (GAUZE/BANDAGES/DRESSINGS) ×1 IMPLANT
DRAPE ARM DVNC X/XI (DISPOSABLE) ×3 IMPLANT
DRAPE COLUMN DVNC XI (DISPOSABLE) ×1 IMPLANT
DRAPE DA VINCI XI ARM (DISPOSABLE) ×3
DRAPE DA VINCI XI COLUMN (DISPOSABLE) ×1
DRAPE ROBOT W/ LEGGING 30X125 (DRAPES) ×2 IMPLANT
DRSG TEGADERM 2-3/8X2-3/4 SM (GAUZE/BANDAGES/DRESSINGS) ×6 IMPLANT
ELECT REM PT RETURN 9FT ADLT (ELECTROSURGICAL) ×2
ELECTRODE REM PT RTRN 9FT ADLT (ELECTROSURGICAL) ×1 IMPLANT
GAUZE 4X4 16PLY ~~LOC~~+RFID DBL (SPONGE) ×2 IMPLANT
GLOVE SURG SYN 6.5 ES PF (GLOVE) ×6 IMPLANT
GLOVE SURG SYN 6.5 PF PI (GLOVE) ×3 IMPLANT
GLOVE SURG UNDER POLY LF SZ6.5 (GLOVE) ×6 IMPLANT
GOWN STRL REUS W/ TWL LRG LVL3 (GOWN DISPOSABLE) ×3 IMPLANT
GOWN STRL REUS W/TWL LRG LVL3 (GOWN DISPOSABLE) ×6
GRASPER SUT TROCAR 14GX15 (MISCELLANEOUS) ×2 IMPLANT
KIT PINK PAD W/HEAD ARE REST (MISCELLANEOUS) ×2
KIT PINK PAD W/HEAD ARM REST (MISCELLANEOUS) ×1 IMPLANT
LABEL OR SOLS (LABEL) ×2 IMPLANT
MANIFOLD NEPTUNE II (INSTRUMENTS) ×2 IMPLANT
MANIPULATOR UTERINE 4.5 ZUMI (MISCELLANEOUS) ×2 IMPLANT
NEEDLE HYPO 22GX1.5 SAFETY (NEEDLE) ×2 IMPLANT
NS IRRIG 1000ML POUR BTL (IV SOLUTION) ×4 IMPLANT
OBTURATOR OPTICAL STANDARD 8MM (TROCAR) ×1
OBTURATOR OPTICAL STND 8 DVNC (TROCAR) ×1
OBTURATOR OPTICALSTD 8 DVNC (TROCAR) ×1 IMPLANT
PACK GYN LAPAROSCOPIC (MISCELLANEOUS) ×2 IMPLANT
PAD ARMBOARD 7.5X6 YLW CONV (MISCELLANEOUS) ×2 IMPLANT
PAD OB MATERNITY 4.3X12.25 (PERSONAL CARE ITEMS) ×2 IMPLANT
PAD PREP 24X41 OB/GYN DISP (PERSONAL CARE ITEMS) ×2 IMPLANT
SCRUB CHG 4% DYNA-HEX 4OZ (MISCELLANEOUS) ×1 IMPLANT
SEAL CANN UNIV 5-8 DVNC XI (MISCELLANEOUS) ×3 IMPLANT
SEAL XI 5MM-8MM UNIVERSAL (MISCELLANEOUS) ×3
SEALER VESSEL DA VINCI XI (MISCELLANEOUS) ×1
SEALER VESSEL EXT DVNC XI (MISCELLANEOUS) ×1 IMPLANT
SET TUBE SMOKE EVAC HIGH FLOW (TUBING) ×2 IMPLANT
SPONGE GAUZE 2X2 8PLY STRL LF (GAUZE/BANDAGES/DRESSINGS) ×6 IMPLANT
SURGILUBE 2OZ TUBE FLIPTOP (MISCELLANEOUS) ×2 IMPLANT
SUT MNCRL 4-0 (SUTURE) ×2
SUT MNCRL 4-0 27XMFL (SUTURE) ×1
SUTURE MNCRL 4-0 27XMF (SUTURE) ×1 IMPLANT
SYR 10ML LL (SYRINGE) ×2 IMPLANT
TAPE TRANSPORE STRL 2 31045 (GAUZE/BANDAGES/DRESSINGS) ×2 IMPLANT
WATER STERILE IRR 500ML POUR (IV SOLUTION) ×2 IMPLANT

## 2022-01-16 NOTE — Discharge Instructions (Addendum)
Postoperative Instructions  Take Motrin 600 mg and Tylenol 1000 mg every 6 hours for pain control.   I recommend taking this medication consistently for at least 1 week after your surgery.  Take Roxicodone 5 mg as needed every 6 hours for severe pain.  Some people will take this before bed to make it easier to sleep.  Do not take this pain medicine longer than 7 days after your surgery.  Please take small walks around your living space every 2-3 hours after your surgery.  You can start this the day of your surgery or the day after your surgery.  Continue this for the first 7 days after surgery. After the first 7 days you can slowly increase you activity.  Nothing in the vagina for 2 weeks after surgery. Please refrain from sexual activity, and tampon usage during that time.  After the surgery your diet should consist of foods that are easy on your stomach.  Soups, sandwiches, crackers, rice, applesauce, popsicles, mashed potatoes, cooked vegetables or canned fruits are best.  After you have a normal bowel movement you can return to your regular diet.  You should have a bowel movement within 3 days of the surgery.  If you not able to have a bowel movement please contact her office so we can review over-the-counter options to help have a bowel movement.  Walking regularly will help with having a bowel movement.  Please let us know if you have any heavy bleeding from the vagina.  Small amounts of bleeding are typical after a hysterectomy. Your bleeding should resolve by 2 weeks after surgery.   If you have a bandage covering your incisions he could remove it the day after your surgery.  Your incisions are closed with dissolvable suture and a topical glue.  The topical glue you can peel off gently in the shower 2 weeks after your surgery.  You should shower every day after the surgery.  You can let soapy water gently run over the incisions to keep them clean.  Please contact us if your incisions appear  infected or if you have any drainage of fluid from the incisions.  I would like to know about these symptoms same day since wound infections can get bad fast.       OBGyn Exam    AMBULATORY SURGERY  DISCHARGE INSTRUCTIONS   The drugs that you were given will stay in your system until tomorrow so for the next 24 hours you should not:  Drive an automobile Make any legal decisions Drink any alcoholic beverage   You may resume regular meals tomorrow.  Today it is better to start with liquids and gradually work up to solid foods.  You may eat anything you prefer, but it is better to start with liquids, then soup and crackers, and gradually work up to solid foods.   Please notify your doctor immediately if you have any unusual bleeding, trouble breathing, redness and pain at the surgery site, drainage, fever, or pain not relieved by medication.    Additional Instructions:        Please contact your physician with any problems or Same Day Surgery at [phone], Monday through Friday 6 am to 4 pm, or [place] at [facility] number at [phone].

## 2022-01-16 NOTE — Anesthesia Postprocedure Evaluation (Signed)
Anesthesia Post Note  Patient: Brenda Dodson  Procedure(s) Performed: XI ROBOTIC ASSISTED SALPINGECTOMY (Bilateral)  Patient location during evaluation: PACU Anesthesia Type: General Level of consciousness: awake and alert Pain management: pain level controlled Vital Signs Assessment: post-procedure vital signs reviewed and stable Respiratory status: spontaneous breathing, nonlabored ventilation, respiratory function stable and patient connected to nasal cannula oxygen Cardiovascular status: blood pressure returned to baseline and stable Postop Assessment: no apparent nausea or vomiting Anesthetic complications: no   No notable events documented.   Last Vitals:  Vitals:   01/16/22 1700 01/16/22 1705  BP: 106/74 101/73  Pulse: 63 60  Resp: 12 12  Temp: 36.6 C 36.6 C  SpO2: 96% 96%    Last Pain:  Vitals:   01/16/22 1719  TempSrc:   PainSc: 4                  Martha Clan

## 2022-01-16 NOTE — Anesthesia Procedure Notes (Signed)
Procedure Name: Intubation Date/Time: 01/16/2022 2:28 PM Performed by: Mohammed Kindle, CRNA Pre-anesthesia Checklist: Patient identified, Emergency Drugs available, Suction available and Patient being monitored Patient Re-evaluated:Patient Re-evaluated prior to induction Oxygen Delivery Method: Circle system utilized Preoxygenation: Pre-oxygenation with 100% oxygen Induction Type: IV induction Ventilation: Mask ventilation without difficulty Laryngoscope Size: McGraph and 3 Grade View: Grade I Tube type: Oral Tube size: 6.5 mm Number of attempts: 1 Airway Equipment and Method: Stylet and Oral airway Placement Confirmation: ETT inserted through vocal cords under direct vision, positive ETCO2, breath sounds checked- equal and bilateral and CO2 detector Secured at: 21 cm Tube secured with: Tape Dental Injury: Teeth and Oropharynx as per pre-operative assessment

## 2022-01-16 NOTE — Interval H&P Note (Signed)
History and Physical Interval Note:  01/16/2022 11:06 AM  Brenda Dodson  has presented today for surgery, with the diagnosis of Sterilization.  The various methods of treatment have been discussed with the patient and family. After consideration of risks, benefits and other options for treatment, the patient has consented to  Procedure(s): XI ROBOTIC ASSISTED SALPINGECTOMY (Bilateral) as a surgical intervention.  The patient's history has been reviewed, patient examined, no change in status, stable for surgery.  I have reviewed the patient's chart and labs.  Questions were answered to the patient's satisfaction.     Lalaine Overstreet R Evalynne Locurto

## 2022-01-16 NOTE — Transfer of Care (Signed)
Immediate Anesthesia Transfer of Care Note  Patient: Brenda Dodson  Procedure(s) Performed: XI ROBOTIC ASSISTED SALPINGECTOMY (Bilateral)  Patient Location: PACU  Anesthesia Type:General  Level of Consciousness: drowsy  Airway & Oxygen Therapy: Patient Spontanous Breathing and Patient connected to face mask oxygen  Post-op Assessment: Report given to RN and Post -op Vital signs reviewed and stable  Post vital signs: Reviewed and stable  Last Vitals:  Vitals Value Taken Time  BP 111/56 01/16/22 1542  Temp 36.2 C 01/16/22 1542  Pulse 66 01/16/22 1544  Resp 12 01/16/22 1544  SpO2 95 % 01/16/22 1544  Vitals shown include unvalidated device data.  Last Pain:  Vitals:   01/16/22 0620  PainSc: 0-No pain         Complications: No notable events documented.

## 2022-01-16 NOTE — Op Note (Signed)
Operative Note   PRE-OP DIAGNOSIS: Desires Sterilization     POST-OP DIAGNOSIS: Desires Sterilization    SURGEON: Adelene Idler MD    ANESTHESIA: General  PROCEDURE: Procedure(s):Robotic assisted laparoscopic bilateral salpingectomy   ESTIMATED BLOOD LOSS: less than 5 cc  DRAINS: Foley  SPECIMENS: Bilateral fallopian tubes    COMPLICATIONS: None  DISPOSITION: PACU  CONDITION: Stable  INDICATIONS: Desires sterilization  FINDINGS: Exam under anesthesia revealed an 8 week uterus. There was no adnexal masses or nodularity. The parametria was smooth. The cervix was negative for gross lesions. Intraoperative findings included: The uterus was  grossly normal. The adnexa was  normal bilaterally. The upper abdomen was  normal including omentum, bowel, liver, stomach, and diaphragmatic surfaces. There was no evidence of grossly enlarged pelvic or right para-aortic lymph nodes.   PROCEDURE IN DETAIL: After informed consent was obtained, the patient was taken to the operating room where anesthesia was obtained without difficulty. The patient was positioned in the dorsal lithotomy position in [place] stirrups and her arms were carefully tucked at her sides and the usual precautions were taken.  She was prepped and draped in normal sterile fashion.  Time-out was performed. A foley catheter was placed. A speculum was placed in the vagina and the cervical os was dilated. The uterus sounded to 8 cm.  A standard zumi uterine manipulator was then placed in the uterus without incident.    Laparoscopic entry was obtained via an umbilical incision and direct entry. The 8 mm robotic optiview port was placed, abdomen insuffulated, and pelvis visualized with noted findings above.  The patient was placed in Trendelenburg and the bowel was displaced up into the upper abdomen.  The 2 additional robotic port were placed in a horizontal line across the upper abdomen. Robotic docking was performed.  The right  and left fallopian tubes were identified. The vessel sealer was used to seal and excise the right and then the left fallopian tube. The fallopian tubes were passed through the robotic port.  Excellent hemostasis was noted.      The instruments were then all removed from the abdomen. The robot was undocked. The air was expelled from the abdomen. The trocars were removed. The skin was closed with 4-0 monocryl with a subcuticular stitch and Indermil glue.  The patient tolerated the procedure well.  Sponge, lap and needle counts were correct x2.  The patient was taken to recovery room in excellent condition.  The foley was removed from the bladder. The uterine manipulator was removed from the uterus.    Adelene Idler MD, [facility], [facility] 01/16/2022 3:38 PM

## 2022-01-17 ENCOUNTER — Encounter: Payer: Self-pay | Admitting: Obstetrics and Gynecology

## 2022-01-18 LAB — SURGICAL PATHOLOGY

## 2022-01-24 ENCOUNTER — Encounter: Payer: Self-pay | Admitting: Obstetrics and Gynecology

## 2022-01-24 ENCOUNTER — Other Ambulatory Visit: Payer: Self-pay

## 2022-01-24 ENCOUNTER — Ambulatory Visit (INDEPENDENT_AMBULATORY_CARE_PROVIDER_SITE_OTHER): Payer: Medicaid Other | Admitting: Obstetrics and Gynecology

## 2022-01-24 VITALS — BP 124/70 | Ht <= 58 in | Wt 182.6 lb

## 2022-01-24 DIAGNOSIS — Z4889 Encounter for other specified surgical aftercare: Secondary | ICD-10-CM

## 2022-01-24 DIAGNOSIS — T8149XA Infection following a procedure, other surgical site, initial encounter: Secondary | ICD-10-CM

## 2022-01-24 MED ORDER — SULFAMETHOXAZOLE-TRIMETHOPRIM 800-160 MG PO TABS: 1.0000 | ORAL_TABLET | Freq: Two times a day (BID) | ORAL | 0 refills | Status: AC

## 2022-01-24 NOTE — Patient Instructions (Signed)
Wound Infection A wound infection happens when tiny organisms (microorganisms) start to grow in a wound. A wound infection is most often caused by bacteria. Infection can cause the wound to break open or worsen. Wound infection needs treatment. If a wound infection is left untreated, complications can occur. Untreated wound infections may lead to an infection in the bloodstream (septicemia) or a bone infection (osteomyelitis). What are the causes? This condition is most often caused by bacteria growing in a wound. Othermicroorganisms, like yeast and fungi, can also cause wound infections. What increases the risk? The following factors may make you more likely to develop this condition: Having a weak body defense system (immune system). Having diabetes. Taking steroid medicines for a long time (chronic use). Smoking. Being an older person. Being overweight. Taking chemotherapy medicines. What are the signs or symptoms? Symptoms of this condition include: Having more redness, swelling, or pain at the wound site. Having more blood or fluid at the wound site. A bad smell coming from a wound or bandage (dressing). Having a fever. Feeling tired or fatigued. Having warmth at or around the wound. Having pus at the wound site. How is this diagnosed? This condition is diagnosed with a medical history and physical exam. You mayalso have a wound culture or blood tests or both. How is this treated? This condition is usually treated with an antibiotic medicine. The infection should improve 24-48 hours after you start antibiotics. After 24-48 hours, redness around the wound should stop spreading, and the wound should be less painful. Follow these instructions at home: Medicines Take or apply over-the-counter and prescription medicines only as told by your health care provider. If you were prescribed an antibiotic medicine, take or apply it as told by your health care provider. Do not stop using the  antibiotic even if you start to feel better. Wound care  Clean the wound each day, or as told by your health care provider. Wash the wound with mild soap and water. Rinse the wound with water to remove all soap. Pat the wound dry with a clean towel. Do not rub it. Follow instructions from your health care provider about how to take care of your wound. Make sure you: Wash your hands with soap and water before and after you change your dressing. If soap and water are not available, use hand sanitizer. Change your dressing as told by your health care provider. Leave stitches (sutures), skin glue, or adhesive strips in place if your wound has been closed. These skin closures may need to stay in place for 2 weeks or longer. If adhesive strip edges start to loosen and curl up, you may trim the loose edges. Do not remove adhesive strips completely unless your health care provider tells you to do that. Some wounds are left open to heal on their own. Check your wound every day for signs of infection. Watch for: More redness, swelling, or pain. More fluid or blood. Warmth. Pus or a bad smell.  General instructions Keep the dressing dry until your health care provider says it can be removed. Do not take baths, swim, or use a hot tub until your health care provider approves. Ask your health care provider if you may take showers. You may only be allowed to take sponge baths. Raise (elevate) the injured area above the level of your heart while you are sitting or lying down. Do not scratch or pick at the wound. Keep all follow-up visits as told by your health care provider.   This is important. Contact a health care provider if: Your pain is not controlled with medicine. You have more redness, swelling, or pain around your wound. You have more fluid or blood coming from your wound. Your wound feels warm to the touch. You have pus coming from your wound. You continue to notice a bad smell coming from your  wound or your dressing. Your wound that was closed breaks open. Get help right away if: You have a red streak going away from your wound. You have a fever. Summary A wound infection happens when tiny organisms (microorganisms) start to grow in a wound. This condition is usually treated with an antibiotic medicine. Follow instructions from your health care provider about how to take care of your wound. Contact a health care provider if your wound infection does not begin to improve in 24-48 hours, or your symptoms worsen. Keep all follow-up visits as told by your health care provider. This is important. This information is not intended to replace advice given to you by your health care provider. Make sure you discuss any questions you have with your healthcare provider. Document Revised: 07/08/2018 Document Reviewed: 07/08/2018 Elsevier Patient Education  2022 Elsevier Inc.  

## 2022-01-24 NOTE — Progress Notes (Signed)
°  Postoperative Follow-up Patient presents post op from  [facility] Robot-assisted Laparoscopic Bilateral Salpingectomy   for  sterilization , 1 week ago.  Subjective: She reports that since the surgery she has been doing well. She did not take off her bandages. She feels like her bowel movements are different. She has been having some pelvic pain and cramping. 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  Eating a regular diet without difficulty.    Activity: normal activities of daily living.   Objective: BP 124/70    Ht 4\' 9"  (1.448 m)    Wt 182 lb 9.6 oz (82.8 kg)    LMP 01/02/2022    BMI 39.51 kg/m  Physical Exam Constitutional:      Appearance: Normal appearance. She is well-developed.  HENT:     Head: Normocephalic and atraumatic.  Eyes:     Extraocular Movements: Extraocular movements intact.     Pupils: Pupils are equal, round, and reactive to light.  Neck:     Thyroid: No thyromegaly.  Cardiovascular:     Rate and Rhythm: Normal rate and regular rhythm.     Heart sounds: Normal heart sounds.  Pulmonary:     Effort: Pulmonary effort is normal.     Breath sounds: Normal breath sounds.  Abdominal:     General: Bowel sounds are normal. There is no distension.     Palpations: Abdomen is soft. There is no mass.     Comments: Incisions are clean and intact. Left and right incisions have small watery drainage. Erythema around incisions sites.  Musculoskeletal:     Cervical back: Neck supple.  Neurological:     Mental Status: She is alert and oriented to person, place, and time.  Skin:    General: Skin is warm and dry.  Psychiatric:        Behavior: Behavior normal.        Thought Content: Thought content normal.        Judgment: Judgment normal.  Vitals reviewed.    Assessment: s/p :  [facility] Robot-assisted Laparoscopic Bilateral Salpingectomy  stable  Plan:  Patient has done well after surgery with no apparent complications.   Wound  infection- will start bactrim.  Encouraged to shower daily and was the incisions gently.   I have discussed the post-operative course to date, and the expected progress moving forward.  The patient understands what complications to be concerned about.  I will see the patient in routine follow up, or sooner if needed.    Activity plan: No heavy lifting. Given a note for work.    01/04/2022 MD, [facility], [facility] 01/24/2022 4:29 PM

## 2022-01-25 ENCOUNTER — Ambulatory Visit: Payer: Medicaid Other | Admitting: Obstetrics and Gynecology

## 2022-02-05 ENCOUNTER — Encounter: Payer: BC Managed Care – PPO | Admitting: Obstetrics and Gynecology

## 2022-02-05 ENCOUNTER — Encounter: Payer: Self-pay | Admitting: Obstetrics and Gynecology

## 2022-02-05 ENCOUNTER — Other Ambulatory Visit: Payer: Self-pay

## 2022-02-05 ENCOUNTER — Ambulatory Visit (INDEPENDENT_AMBULATORY_CARE_PROVIDER_SITE_OTHER): Payer: BC Managed Care – PPO | Admitting: Obstetrics and Gynecology

## 2022-02-05 VITALS — BP 120/72 | Ht <= 58 in | Wt 180.0 lb

## 2022-02-05 DIAGNOSIS — Z4889 Encounter for other specified surgical aftercare: Secondary | ICD-10-CM

## 2022-02-05 NOTE — Progress Notes (Signed)
°  Postoperative Follow-up Patient presents post op from  [facility] Robot-assisted Laparoscopic Bilateral Salpingectomy  for  Sterilization , 3 weeks ago.  Subjective: She reports that since the surgery she has been doing well. She reports she completed her antibiotic and the incisions have been itching but feeling better. Eating a regular diet without difficulty.  The patient is not having any pain.   Activity: normal activities of daily living.   Objective: BP 120/72    Ht 4\' 9"  (1.448 m)    Wt 180 lb (81.6 kg)    BMI 38.95 kg/m  Physical Exam Constitutional:      Appearance: Normal appearance. She is well-developed.  HENT:     Head: Normocephalic and atraumatic.  Eyes:     Extraocular Movements: Extraocular movements intact.     Pupils: Pupils are equal, round, and reactive to light.  Neck:     Thyroid: No thyromegaly.  Cardiovascular:     Rate and Rhythm: Normal rate and regular rhythm.     Heart sounds: Normal heart sounds.  Pulmonary:     Effort: Pulmonary effort is normal.     Breath sounds: Normal breath sounds.  Abdominal:     General: Bowel sounds are normal. There is no distension.     Palpations: Abdomen is soft. There is no mass.     Comments: Incisions are clean dry and intact, small remnants of surgical glue removed with alcohol swab.  Musculoskeletal:     Cervical back: Neck supple.  Neurological:     Mental Status: She is alert and oriented to person, place, and time.  Skin:    General: Skin is warm and dry.  Psychiatric:        Behavior: Behavior normal.        Thought Content: Thought content normal.        Judgment: Judgment normal.  Vitals reviewed.    Assessment: s/p :  [facility] Robot-assisted Laparoscopic Bilateral Salpingectomy stable  Plan:  Patient has done well after surgery with no apparent complications.    I have discussed the post-operative course to date, and the expected progress moving forward.  The patient understands what complications to be  concerned about.  I will see the patient in routine follow up, or sooner if needed.    Activity plan: No restriction.     MD, [facility], [facility] 02/05/2022 11:48 AM

## 2023-03-13 NOTE — Progress Notes (Unsigned)
New patient visit   Patient: Brenda Dodson   DOB: September 20, 1986   37 y.o. Female  MRN: [id number] Visit Date: 03/14/2023  Today's healthcare provider: Mikey Kirschner, PA-C   No chief complaint on file.  Subjective    Brenda Dodson is a 37 y.o. female who presents today as a new patient to establish care.  HPI  ***  Past Medical History:  Diagnosis Date   ASCUS with positive high risk HPV cervical    Chlamydia 2017   Pneumonia    Past Surgical History:  Procedure Laterality Date   APPENDECTOMY     COLPOSCOPY     XI ROBOTIC ASSISTED SALPINGECTOMY Bilateral 01/16/2022   Procedure: XI ROBOTIC ASSISTED SALPINGECTOMY;  Surgeon: Homero Fellers, MD;  Location: [facility] ORS;  Service: Gynecology;  Laterality: Bilateral;   Family Status  Relation Name Status   Father  Alive   Mother  Alive   Neg Hx  (Not Specified)   Family History  Problem Relation Age of Onset   Prostate cancer Father    Seizures Father    Diabetes Mother    Breast cancer Neg Hx    Ovarian cancer Neg Hx    Colon cancer Neg Hx    Social History   Socioeconomic History   Marital status: Married    Spouse name: Brenda Dodson   Number of children: Not on file   Years of education: Not on file   Highest education level: Not on file  Occupational History   Not on file  Tobacco Use   Smoking status: Never   Smokeless tobacco: Never  Vaping Use   Vaping Use: Never used  Substance and Sexual Activity   Alcohol use: Not Currently   Drug use: Not Currently   Sexual activity: Yes    Birth control/protection: None  Other Topics Concern   Not on file  Social History Narrative   ** Merged History Encounter **       Social Determinants of Health   Financial Resource Strain: Not on file  Food Insecurity: Not on file  Transportation Needs: Not on file  Physical Activity: Not on file  Stress: Not on file  Social Connections: Not on file   Outpatient Medications Prior to Visit  Medication Sig    ibuprofen (ADVIL) 600 MG tablet Take 1 tablet (600 mg total) by mouth every 6 (six) hours as needed. (Patient not taking: Reported on 01/24/2022)   No facility-administered medications prior to visit.   No Known Allergies  Immunization History  Administered Date(s) Administered   PFIZER Comirnaty(Gray Top)Covid-19 Tri-Sucrose Vaccine 05/27/2020, 06/16/2020   Tdap 06/14/2021    Health Maintenance  Topic Date Due   Hepatitis C Screening  Never done   COVID-19 Vaccine (3 - 2023-24 season) 08/10/2022   INFLUENZA VACCINE  07/11/2023   PAP SMEAR-Modifier  03/21/2026   DTaP/Tdap/Td (2 - Td or Tdap) 06/15/2031   HIV Screening  Completed   HPV VACCINES  Aged Out    Patient Care Team: Will Bonnet, MD as PCP - General (Obstetrics and Gynecology) Pcp, No  Review of Systems  {Labs  Heme  Chem  Endocrine  Serology  Results Review (optional):23779}   Objective    There were no vitals taken for this visit. {Show previous vital signs (optional):23777}  Physical Exam ***  Depression Screen    06/14/2021   10:28 AM  PHQ 2/9 Scores  PHQ - 2 Score 6  PHQ- 9 Score 24  No results found for any visits on 03/14/23.  Assessment & Plan     ***  No follow-ups on file.     {provider attestation***:1}   Mikey Kirschner, PA-C  [place] [phone] (phone) [phone] (fax)  [place]

## 2023-03-14 ENCOUNTER — Encounter: Payer: Self-pay | Admitting: Physician Assistant

## 2023-03-14 ENCOUNTER — Ambulatory Visit (INDEPENDENT_AMBULATORY_CARE_PROVIDER_SITE_OTHER): Payer: BC Managed Care – PPO | Admitting: Physician Assistant

## 2023-03-14 VITALS — BP 110/75 | HR 68 | Temp 98.2°F | Wt 182.0 lb

## 2023-03-14 DIAGNOSIS — R32 Unspecified urinary incontinence: Secondary | ICD-10-CM | POA: Diagnosis not present

## 2023-03-14 DIAGNOSIS — Z0000 Encounter for general adult medical examination without abnormal findings: Secondary | ICD-10-CM | POA: Diagnosis not present

## 2023-03-14 DIAGNOSIS — Q831 Accessory breast: Secondary | ICD-10-CM

## 2023-03-14 DIAGNOSIS — D649 Anemia, unspecified: Secondary | ICD-10-CM | POA: Diagnosis not present

## 2023-03-14 NOTE — Assessment & Plan Note (Signed)
Advised normal. No lumps or adenopathy appreciated

## 2023-03-14 NOTE — Assessment & Plan Note (Signed)
Recommended at home pelvic floor strengthening exercises; gave some options today.  If no improvement at home can formally refer to pft

## 2023-03-14 NOTE — Assessment & Plan Note (Signed)
Historically, repeat cbc/ iron panel

## 2023-03-14 NOTE — Patient Instructions (Addendum)
[email or web address]-[email or web address]  Brenda Dodson are a great way to train your pelvic muscle by contracting and relaxing your pelvic floor. To perform this exercise, you must:  Position yourself comfortably, whether you are sitting or standing. Be sure to maintain the normal inward curve of your lower spine. Identify and activate your pelvic floor muscles with a lift and squeeze motion, breathing normally throughout. Attempt to perform the lift and squeeze motion up for up to 10 seconds. Completely relax your pelvic muscles before another set. This exercise may be repeated up to 12 repetitions in a row to complete a full set. Three sets throughout the day are the goal.  Bridge While most may think the bridge is a great exercise for your glutes, it can also help strengthen your pelvic floor muscles. For this workout, you will:  Lie on the floor with your back flat against the ground and your knees bent at a 90-degree angle. Your feet should be flat on the floor with your arms at your side, palms facing down. Pushing through your heels, raise your hips off the ground by squeezing your glutes, pelvic floor, and hamstrings. Pause for a few seconds and return to the first position. woman doing pelvic lift abdominal exercise at home sport, fitness and healthy lifestyle concept - African American woman doing pelvic lift abdominal exercise at home [facility] dog The bird dog move helps to engage many muscle groups throughout your body, including your pelvic floor. To complete this exercise, you can:  Get on your hands and knees, position your wrists under your shoulders and your knees under your hips. Ensure your back is as straight as possible. Brace your core muscles and draw your shoulder blades back down toward your hips. At the same time, straighten and lift your left leg and right arm, keeping the rest of your body in a neutral position. Hold for a few  seconds. Lower your arm and leg back down to their original position. Repeat the move with the opposite extremities.

## 2023-03-15 LAB — IRON,TIBC AND FERRITIN PANEL
Ferritin: 83 ng/mL (ref 15–150)
Iron Saturation: 11 % — ABNORMAL LOW (ref 15–55)
Iron: 33 ug/dL (ref 27–159)
Total Iron Binding Capacity: 303 ug/dL (ref 250–450)
UIBC: 270 ug/dL (ref 131–425)

## 2023-03-15 LAB — CBC WITH DIFFERENTIAL/PLATELET
Basophils Absolute: 0 10*3/uL (ref 0.0–0.2)
Basos: 1 %
EOS (ABSOLUTE): 0.2 10*3/uL (ref 0.0–0.4)
Eos: 3 %
Hematocrit: 39.1 % (ref 34.0–46.6)
Hemoglobin: 12.4 g/dL (ref 11.1–15.9)
Immature Grans (Abs): 0 10*3/uL (ref 0.0–0.1)
Immature Granulocytes: 0 %
Lymphocytes Absolute: 1.8 10*3/uL (ref 0.7–3.1)
Lymphs: 35 %
MCH: 26.5 pg — ABNORMAL LOW (ref 26.6–33.0)
MCHC: 31.7 g/dL (ref 31.5–35.7)
MCV: 84 fL (ref 79–97)
Monocytes Absolute: 0.5 10*3/uL (ref 0.1–0.9)
Monocytes: 9 %
Neutrophils Absolute: 2.7 10*3/uL (ref 1.4–7.0)
Neutrophils: 52 %
Platelets: 306 10*3/uL (ref 150–450)
RBC: 4.68 x10E6/uL (ref 3.77–5.28)
RDW: 13.9 % (ref 11.7–15.4)
WBC: 5.1 10*3/uL (ref 3.4–10.8)

## 2023-03-15 LAB — COMPREHENSIVE METABOLIC PANEL
ALT: 8 IU/L (ref 0–32)
AST: 13 IU/L (ref 0–40)
Albumin/Globulin Ratio: 1.5 (ref 1.2–2.2)
Albumin: 3.8 g/dL — ABNORMAL LOW (ref 3.9–4.9)
Alkaline Phosphatase: 63 IU/L (ref 44–121)
BUN/Creatinine Ratio: 16 (ref 9–23)
BUN: 12 mg/dL (ref 6–20)
Bilirubin Total: 0.2 mg/dL (ref 0.0–1.2)
CO2: 22 mmol/L (ref 20–29)
Calcium: 8.9 mg/dL (ref 8.7–10.2)
Chloride: 105 mmol/L (ref 96–106)
Creatinine, Ser: 0.77 mg/dL (ref 0.57–1.00)
Globulin, Total: 2.5 g/dL (ref 1.5–4.5)
Glucose: 83 mg/dL (ref 70–99)
Potassium: 3.8 mmol/L (ref 3.5–5.2)
Sodium: 138 mmol/L (ref 134–144)
Total Protein: 6.3 g/dL (ref 6.0–8.5)
eGFR: 103 mL/min/{1.73_m2} (ref >59)

## 2023-03-15 LAB — LIPID PANEL
Chol/HDL Ratio: 2.4 ratio (ref 0.0–4.4)
Cholesterol, Total: 161 mg/dL (ref 100–199)
HDL: 67 mg/dL (ref >39)
LDL Chol Calc (NIH): 86 mg/dL (ref 0–99)
Triglycerides: 34 mg/dL (ref 0–149)
VLDL Cholesterol Cal: 8 mg/dL (ref 5–40)

## 2023-03-15 LAB — HEMOGLOBIN A1C
Est. average glucose Bld gHb Est-mCnc: 114 mg/dL
Hgb A1c MFr Bld: 5.6 % (ref 4.8–5.6)

## 2023-09-11 ENCOUNTER — Ambulatory Visit: Payer: Self-pay | Admitting: *Deleted

## 2023-09-11 NOTE — Telephone Encounter (Signed)
Reason for Disposition  [1] Chest pain(s) lasting a few seconds AND [2] persists > 3 days  Answer Assessment - Initial Assessment Questions 1. LOCATION: "Where does it hurt?"       It happens regularly this chest pain.   In Oct 2022 I had chest pain then too.   It happens often.   It's in the middle of my chest sometimes it's to the left and right. 2. RADIATION: "Does the pain go anywhere else?" (e.g., into neck, jaw, arms, back)     It goes between my shoulder blades in my upper back.     No radiation except through to my upper back.    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 3. ONSET: "When did the chest pain begin?" (Minutes, hours or days)      It's been regularly hurting for the last 2 months. 4. PATTERN: "Does the pain come and go, or has it been constant since it started?"  "Does it get worse with exertion?"      Comes and goes 5. DURATION: "How long does it last" (e.g., seconds, minutes, hours)     It lasts a few min. 6. SEVERITY: "How bad is the pain?"  (e.g., Scale 1-10; mild, moderate, or severe)    - MILD (1-3): doesn't interfere with normal activities     - MODERATE (4-7): interferes with normal activities or awakens from sleep    - SEVERE (8-10): excruciating pain, unable to do any normal activities       Mild pain and I feel like heart rate speeds up. 7. CARDIAC RISK FACTORS: "Do you have any history of heart problems or risk factors for heart disease?" (e.g., angina, prior heart attack; diabetes, high blood pressure, high cholesterol, smoker, or strong family history of heart disease)     No high BP or diabetes.  8. PULMONARY RISK FACTORS: "Do you have any history of lung disease?"  (e.g., blood clots in lung, asthma, emphysema, birth control pills)     Not asked 9. CAUSE: "What do you think is causing the chest pain?"     Maybe stressed 10. OTHER SYMPTOMS: "Do you have any other symptoms?" (e.g., dizziness, nausea, vomiting, sweating, fever, difficulty  breathing, cough)       No 11. PREGNANCY: "Is there any chance you are pregnant?" "When was your last menstrual period?"       Not asked  Protocols used: Chest Pain-A-AH

## 2023-09-11 NOTE — Telephone Encounter (Signed)
  Chief Complaint: Intermittent chest pain for last 2 months. Symptoms: pain in middle of chest and through to between up shoulder blades   Frequency: "It's been happening on a frequent basis but over the last 2 months more frequently".   It's been going on as far back as 2022. Pertinent Negatives: Patient denies sweating, shortness of breath, radiation of pain other than to her upper back, no dizziness. Disposition: [] ED /[] Urgent Care (no appt availability in office) / [x] Appointment(In office/virtual)/ []  [place] [facility]/ [] Home Care/ [] Refused Recommended Disposition /[] [place] [facility]/ []  Follow-up with PCP Additional Notes: Appt made with Dr. Roxan Hockey for 09/17/2023 at 10:40 with instructions to go on to the ED if her symptoms became worse or constant.  She verbalized understanding.

## 2023-09-16 NOTE — Progress Notes (Unsigned)
      Established patient visit   Patient: Brenda Dodson   DOB: 1986/07/18   37 y.o. Female  MRN: [id number] Visit Date: 09/17/2023  Today's healthcare provider: Ronnald Ramp, MD   No chief complaint on file.  Subjective       Discussed the use of AI scribe software for clinical note transcription with the patient, who gave verbal consent to proceed.  History of Present Illness             Past Medical History:  Diagnosis Date   ASCUS with positive high risk HPV cervical    Chlamydia 2017   Pneumonia     Medications: No outpatient medications prior to visit.   No facility-administered medications prior to visit.    Review of Systems  {Insert previous labs (optional):23779} {See past labs  Heme  Chem  Endocrine  Serology  Results Review (optional):1}   Objective    There were no vitals taken for this visit. {Insert last BP/Wt (optional):23777}{See vitals history (optional):1}   Physical Exam  ***  No results found for any visits on 09/17/23.  Assessment & Plan     Problem List Items Addressed This Visit   None   Assessment and Plan              No follow-ups on file.         Ronnald Ramp, MD  [facility] [phone] (phone) [phone] (fax)  [facility]

## 2023-09-17 ENCOUNTER — Ambulatory Visit: Payer: BC Managed Care – PPO | Admitting: Family Medicine

## 2023-09-17 ENCOUNTER — Encounter: Payer: Self-pay | Admitting: Family Medicine

## 2023-09-17 VITALS — BP 118/81 | HR 72 | Ht <= 58 in | Wt 178.0 lb

## 2023-09-17 DIAGNOSIS — R0789 Other chest pain: Secondary | ICD-10-CM | POA: Diagnosis not present

## 2023-09-17 DIAGNOSIS — I440 Atrioventricular block, first degree: Secondary | ICD-10-CM

## 2023-09-17 DIAGNOSIS — F439 Reaction to severe stress, unspecified: Secondary | ICD-10-CM

## 2023-09-17 DIAGNOSIS — R072 Precordial pain: Secondary | ICD-10-CM

## 2023-09-17 MED ORDER — BUSPIRONE HCL 5 MG PO TABS: 5.0000 mg | ORAL_TABLET | Freq: Two times a day (BID) | ORAL | 0 refills | Status: DC

## 2023-09-17 NOTE — Assessment & Plan Note (Signed)
Described as sharp, squeezing, and shooting through the chest, lasting for a few seconds to five minutes. Occurs during physical exertion at work. No associated shortness of breath, but reports increased heart rate during episodes. Family history of blood clot issues and cancer. No personal history of blood clots or heart rhythm abnormalities. -Order chest X-ray to rule out lung issues. -Refer to cardiology for echocardiogram to assess heart structure. -Perform EKG today to assess electrical conduction system of the heart.  NSR, no ischemia findings, prolonged PR interval, normal Qtc -CMP & mag  -Follow-up in 3-4 weeks to reassess symptoms

## 2023-09-17 NOTE — Patient Instructions (Addendum)
Please report to [facility] located at:  [address]  [place], [place] [address]  You do not need an appointment to have xrays completed.   Our office will follow up with  results once available.  A referral has been placed on your behalf for cardiology. Our referral coordination team or the office you will be visiting will contact you within the next 2 weeks.  If you have not received a phone call within 10 business days please let us know so that we can check into this for you.

## 2023-09-19 ENCOUNTER — Ambulatory Visit
Admission: RE | Admit: 2023-09-19 | Discharge: 2023-09-19 | Disposition: A | Discharge status: Home / Self Care | Payer: BC Managed Care – PPO | Attending: Family Medicine | Admitting: Family Medicine

## 2023-09-19 ENCOUNTER — Ambulatory Visit
Admission: RE | Admit: 2023-09-19 | Discharge: 2023-09-19 | Disposition: A | Discharge status: Home / Self Care | Payer: BC Managed Care – PPO | Source: Ambulatory Visit | Attending: Family Medicine | Admitting: Family Medicine

## 2023-09-19 DIAGNOSIS — R072 Precordial pain: Secondary | ICD-10-CM

## 2023-09-19 DIAGNOSIS — I440 Atrioventricular block, first degree: Secondary | ICD-10-CM | POA: Diagnosis not present

## 2023-09-19 DIAGNOSIS — R079 Chest pain, unspecified: Secondary | ICD-10-CM | POA: Diagnosis not present

## 2023-09-20 LAB — COMPREHENSIVE METABOLIC PANEL
ALT: 8 [IU]/L (ref 0–32)
AST: 14 [IU]/L (ref 0–40)
Albumin: 3.9 g/dL (ref 3.9–4.9)
Alkaline Phosphatase: 54 [IU]/L (ref 44–121)
BUN/Creatinine Ratio: 17 (ref 9–23)
BUN: 14 mg/dL (ref 6–20)
Bilirubin Total: 0.2 mg/dL (ref 0.0–1.2)
CO2: 21 mmol/L (ref 20–29)
Calcium: 8.9 mg/dL (ref 8.7–10.2)
Chloride: 106 mmol/L (ref 96–106)
Creatinine, Ser: 0.81 mg/dL (ref 0.57–1.00)
Globulin, Total: 2.8 g/dL (ref 1.5–4.5)
Glucose: 76 mg/dL (ref 70–99)
Potassium: 3.8 mmol/L (ref 3.5–5.2)
Sodium: 142 mmol/L (ref 134–144)
Total Protein: 6.7 g/dL (ref 6.0–8.5)
eGFR: 97 mL/min/{1.73_m2} (ref >59)

## 2023-09-20 LAB — MAGNESIUM: Magnesium: 2 mg/dL (ref 1.6–2.3)

## 2023-10-09 ENCOUNTER — Ambulatory Visit: Payer: BC Managed Care – PPO | Admitting: Family Medicine

## 2023-10-09 ENCOUNTER — Encounter: Payer: Self-pay | Admitting: Family Medicine

## 2023-10-09 VITALS — BP 101/81 | HR 92 | Temp 98.4°F | Ht <= 58 in | Wt 180.0 lb

## 2023-10-09 DIAGNOSIS — R0789 Other chest pain: Secondary | ICD-10-CM

## 2023-10-09 DIAGNOSIS — R002 Palpitations: Secondary | ICD-10-CM | POA: Insufficient documentation

## 2023-10-09 NOTE — Assessment & Plan Note (Signed)
Intermittent chest pain, bilateral, with associated rapid heart rate. Normal chest x-ray. EKG showed sinus rhythm with no signs of ST elevation, ventricular hypertrophy, or acute MI. Patient has a cardiology appointment scheduled for 10/24/2023. -Start Aspirin 81mg  daily until cardiology appointment. -Trial of NSAIDs (Ibuprofen 200-400mg  every 6 hours) for 1 week to assess for possible costochondritis. -Check thyroid levels to assess for possible contribution to rapid heart rate. -Avoid nicotine and caffeine. -follow up with cardiology as scheduled on 10/24/23 -Follow-up in 2 months or sooner if cardiology recommends

## 2023-10-09 NOTE — Assessment & Plan Note (Signed)
Patient reports feeling of rapid heart rate, particularly upon waking from sleep. Heart rate at higher end of normal range during visit (92 bpm). -Check thyroid levels. -Avoid nicotine and caffeine. -Continue monitoring with cardiology.

## 2023-10-09 NOTE — Patient Instructions (Signed)
VISIT SUMMARY:  During your follow-up consultation, we discussed your intermittent chest pain, rapid heart rate, and work-related stress. 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 We also talked about your rapid heartbeats, especially upon waking, and your concerns about secondhand smoke exposure and potential blocked arteries. You are open to starting an aspirin regimen and using NSAIDs to manage your symptoms. We also discussed the possibility of applying for short-term disability due to your health concerns.  YOUR PLAN:  -CHEST PAIN: Chest pain can have various causes, including heart-related issues or inflammation of the chest wall. Your chest x-ray was normal, and your EKG showed no signs of a heart attack. We will start you on Aspirin 81mg  daily until your cardiology appointment on 10/24/2023. You will also try NSAIDs (Ibuprofen 200-400mg  every 6 hours) for 1 week to see if it helps with possible costochondritis. We will check your thyroid levels to see if they are contributing to your rapid heart rate. Please avoid nicotine and caffeine. Follow up in 2 months or sooner if recommended by cardiology.  -INCREASED HEART RATE: An increased heart rate can be due to various factors, including stress, thyroid issues, or stimulants. Your heart rate was at the higher end of the normal range during your visit. We will check your thyroid levels and ask you to avoid nicotine and caffeine. Continue monitoring your heart rate with your cardiologist.  -WORK-RELATED STRESS: Stress from work can contribute to physical symptoms like chest pain and rapid heart rate. We discussed stress management strategies, and you may consider counseling if your symptoms persist.  INSTRUCTIONS:  Please follow up with cardiology on 10/24/2023. Avoid nicotine and caffeine, and take Aspirin 81mg  daily until your appointment. Use NSAIDs (Ibuprofen 200-400mg  every 6  hours) for 1 week to see if it helps with your chest pain. We will check your thyroid levels to assess for any contribution to your rapid heart rate. Follow up in 2 months or sooner if recommended by cardiology.

## 2023-10-09 NOTE — Progress Notes (Signed)
Established patient visit   Patient: Brenda Dodson   DOB: October 18, 1986   37 y.o. Female  MRN: [id number] Visit Date: 10/09/2023  Today's healthcare provider: Ronnald Ramp, MD   Chief Complaint  Patient presents with   Chest Pain    Patient was last seen on 09/17/2023.  She states the pain is some better but still present.  She has appointment with cardiology on 10/24/23. Patient said she wanted to suggest you put her on  disability.  Patient also states that she wants the specialist to do a monitor instead of a stress test.  I explained to her that he will need to determine what is needed based on what he suspects is causing her pain.    Subjective     HPI     Chest Pain    Additional comments: Patient was last seen on 09/17/2023.  She states the pain is some better but still present.  She has appointment with cardiology on 10/24/23. Patient said she wanted to suggest you put her on  disability.  Patient also states that she wants the specialist to do a monitor instead of a stress test.  I explained to her that he will need to determine what is needed based on what he suspects is causing her pain.       Last edited by Adline Peals, CMA on 10/09/2023 10:02 AM.       Discussed the use of AI scribe software for clinical note transcription with the patient, who gave verbal consent to proceed.  History of Present Illness   The patient, with a history of chest pains and discomfort, presents for a follow-up consultation. She reports that the chest pain, which intermittently occurs on both sides, has seemingly decreased in frequency. However, she describes an episode of prolonged chest pain that occurred during her night shift work, which was concerning enough to consider seeking immediate medical help. The pain was located on the right side and radiated to the back of her shoulder blade.  The patient also reports instances of rapid heartbeats, particularly noticeable  when waking up abruptly from sleep. She mentions feeling 'drowsy' or 'woozy' at times, and wonders if her altered sleep pattern due to night shift work could be contributing to her symptoms. She also expresses concern about potential exposure to secondhand smoke from a family member who is a heavy smoker.  She expresses concern about the possibility of blocked arteries and is interested in understanding the potential causes of her symptoms.  The patient denies experiencing any reflux symptoms and reports only occasional discomfort when taking deep breaths, particularly after physical exertion such as climbing stairs. She also mentions leg soreness from standing all night at work.  The patient is not currently taking aspirin and has expressed willingness to start an aspirin regimen and potentially use an inhaler for bronchoconstriction. She is also open to trying non-steroidal anti-inflammatory drugs (NSAIDs) to manage potential costochondritis. She is interested in exploring the possibility of applying for short-term disability from work due to her health concerns.     Scheduled to see cardiology 10/24/23  Past Medical History:  Diagnosis Date   ASCUS with positive high risk HPV cervical    Chlamydia 2017   Pneumonia     Medications: Outpatient Medications Prior to Visit  Medication Sig   busPIRone (BUSPAR) 5 MG tablet Take 1 tablet (5 mg total) by mouth 2 (two) times daily.   No facility-administered medications prior to visit.  Review of Systems  Last CBC Lab Results  Component Value Date   WBC 5.1 03/14/2023   HGB 12.4 03/14/2023   HCT 39.1 03/14/2023   MCV 84 03/14/2023   MCH 26.5 (L) 03/14/2023   RDW 13.9 03/14/2023   PLT 306 03/14/2023   Last metabolic panel Lab Results  Component Value Date   GLUCOSE 76 09/19/2023   NA 142 09/19/2023   K 3.8 09/19/2023   CL 106 09/19/2023   CO2 21 09/19/2023   BUN 14 09/19/2023   CREATININE 0.81 09/19/2023   EGFR 97 09/19/2023    CALCIUM 8.9 09/19/2023   PROT 6.7 09/19/2023   ALBUMIN 3.9 09/19/2023   LABGLOB 2.8 09/19/2023   AGRATIO 1.5 03/14/2023   BILITOT 0.2 09/19/2023   ALKPHOS 54 09/19/2023   AST 14 09/19/2023   ALT 8 09/19/2023   ANIONGAP 10 09/16/2021   Last lipids Lab Results  Component Value Date   CHOL 161 03/14/2023   HDL 67 03/14/2023   LDLCALC 86 03/14/2023   TRIG 34 03/14/2023   CHOLHDL 2.4 03/14/2023   Last hemoglobin A1c Lab Results  Component Value Date   HGBA1C 5.6 03/14/2023   Last thyroid functions No results found for: "TSH", "T3TOTAL", "T4TOTAL", "THYROIDAB"      Objective    BP 101/81 (BP Location: Left Arm, Patient Position: Sitting, Cuff Size: Large)   Pulse 92   Temp 98.4 F (36.9 C) (Oral)   Ht 4\' 9"  (1.448 m)   Wt 180 lb (81.6 kg)   LMP 09/19/2023   SpO2 100%   BMI 38.95 kg/m   BP Readings from Last 3 Encounters:  10/09/23 101/81  09/17/23 118/81  03/14/23 110/75   Wt Readings from Last 3 Encounters:  10/09/23 180 lb (81.6 kg)  09/17/23 178 lb (80.7 kg)  03/14/23 182 lb (82.6 kg)        Wt Readings from Last 3 Encounters:  10/09/23 180 lb (81.6 kg)  09/17/23 178 lb (80.7 kg)  03/14/23 182 lb (82.6 kg)   BP Readings from Last 3 Encounters:  10/09/23 101/81  09/17/23 118/81  03/14/23 110/75     Physical Exam  Physical Exam   VITALS: SaO2- 99%, HR- 74 CARDIOVASCULAR: EKG shows sinus rhythm, no signs of ST elevation, no ventricular hypertrophy, normal intervals, narrow QRS, regular P waves, heart rate in the seventies, no signs of acute MI     General: Alert, no acute distress Cardio: Normal S1 and S2, RRR, no r/m/g, no tenderness palpation of chest wall  Pulm: CTAB, normal work of breathing Abdomen: Bowel sounds normal. Abdomen soft and non-tender.    No results found for any visits on 10/09/23.  Assessment & Plan     Problem List Items Addressed This Visit       Other   Palpitations    Patient reports feeling of rapid heart  rate, particularly upon waking from sleep. Heart rate at higher end of normal range during visit (92 bpm). -Check thyroid levels. -Avoid nicotine and caffeine. -Continue monitoring with cardiology.      Relevant Orders   TSH+T4F+T3Free   EKG 12-Lead   Chest pain, mid sternal - Primary    Intermittent chest pain, bilateral, with associated rapid heart rate. Normal chest x-ray. EKG showed sinus rhythm with no signs of ST elevation, ventricular hypertrophy, or acute MI. Patient has a cardiology appointment scheduled for 10/24/2023. -Start Aspirin 81mg  daily until cardiology appointment. -Trial of NSAIDs (Ibuprofen 200-400mg  every 6 hours) for 1  week to assess for possible costochondritis. -Check thyroid levels to assess for possible contribution to rapid heart rate. -Avoid nicotine and caffeine. -follow up with cardiology as scheduled on 10/24/23 -Follow-up in 2 months or sooner if cardiology recommends      Relevant Orders   TSH+T4F+T3Free   EKG 12-Lead     Work-Related Stress Patient reports increased stress due to work, which may be contributing to symptoms. -Consider discussing stress management strategies or referral for counseling if symptoms persist.        Return in about 2 months (around 12/09/2023) for chest pain,HR.         Ronnald Ramp, MD  [facility] [phone] (phone) [phone] (fax)  [facility]

## 2023-10-10 LAB — TSH+T4F+T3FREE
Free T4: 1.2 ng/dL (ref 0.82–1.77)
T3, Free: 3 pg/mL (ref 2.0–4.4)
TSH: 2.87 u[IU]/mL (ref 0.450–4.500)

## 2023-10-24 ENCOUNTER — Encounter: Payer: Self-pay | Admitting: Cardiology

## 2023-10-24 ENCOUNTER — Ambulatory Visit: Payer: BC Managed Care – PPO | Attending: Cardiology | Admitting: Cardiology

## 2023-10-24 VITALS — BP 106/78 | HR 64 | Ht <= 58 in | Wt 178.8 lb

## 2023-10-24 DIAGNOSIS — R072 Precordial pain: Secondary | ICD-10-CM | POA: Diagnosis not present

## 2023-10-24 MED ORDER — METOPROLOL TARTRATE 100 MG PO TABS: 100.0000 mg | ORAL_TABLET | Freq: Once | ORAL | 0 refills | Status: DC

## 2023-10-24 NOTE — Progress Notes (Signed)
Cardiology Office Note:    Date:  10/24/2023   ID:  Brenda Dodson, DOB 1986-12-05, MRN [id number]  PCP:  Ronnald Ramp, MD   [facility] Providers Cardiologist:  Debbe Odea, MD     Referring MD: Brett Albino*   Chief Complaint  Patient presents with   New Patient (Initial Visit)    Referred for cardiac evaluation of Precordial pain.  Patient reports intermittent mid chest pain.     Brenda Dodson is a 37 y.o. female who is being seen today for the evaluation of chest pain at the request of [facility], [facility]*.   History of Present Illness:    Brenda Dodson is a 37 y.o. female with no significant past medical history who presents with chest pain.  States having symptoms of central chest pain occurring randomly, not related with exertion.  Symptoms have been ongoing over the past 2 months.  Denies any personal or family history of heart disease.  Works in [employer], stands most of the time during her shift.  Thinks symptoms may be associated with stress.  Past Medical History:  Diagnosis Date   ASCUS with positive high risk HPV cervical    Chlamydia 2017   Pneumonia     Past Surgical History:  Procedure Laterality Date   APPENDECTOMY     COLPOSCOPY     TUBAL LIGATION     XI ROBOTIC ASSISTED SALPINGECTOMY Bilateral 01/16/2022   Procedure: XI ROBOTIC ASSISTED SALPINGECTOMY;  Surgeon: Natale Milch, MD;  Location: [facility] ORS;  Service: Gynecology;  Laterality: Bilateral;    Current Medications: Current Meds  Medication Sig   metoprolol tartrate (LOPRESSOR) 100 MG tablet Take 1 tablet (100 mg total) by mouth once for 1 dose. TWO HOURS PRIOR TO CARDIAC CTA     Allergies:   Patient has no known allergies.   Social History   Socioeconomic History   Marital status: Married    Spouse name: Georgeen Hawk   Number of children: Not on file   Years of education: Not on file   Highest education level: Not on file   Occupational History   Not on file  Tobacco Use   Smoking status: Never   Smokeless tobacco: Never  Vaping Use   Vaping status: Never Used  Substance and Sexual Activity   Alcohol use: Yes    Alcohol/week: 12.0 standard drinks of alcohol    Types: 8 Glasses of wine, 4 Cans of beer per week   Drug use: Not Currently   Sexual activity: Yes    Birth control/protection: None  Other Topics Concern   Not on file  Social History Narrative   ** Merged History Encounter **       Social Determinants of Health   Financial Resource Strain: Not on file  Food Insecurity: Not on file  Transportation Needs: Not on file  Physical Activity: Not on file  Stress: Not on file  Social Connections: Not on file     Family History: The patient's family history includes Alcohol abuse in her father; Cancer in her father, maternal grandfather, and paternal aunt; Diabetes in her mother; Hypertension in her father; Prostate cancer in her father; Seizures in her father; Stroke in her father. There is no history of Breast cancer, Ovarian cancer, or Colon cancer.  ROS:   Please see the history of present illness.     All other systems reviewed and are negative.  EKGs/Labs/Other Studies Reviewed:    The following studies were reviewed today:  EKG Interpretation Date/Time:  Thursday October 24 2023 09:54:40 EST Ventricular Rate:  64 PR Interval:  168 QRS Duration:  88 QT Interval:  386 QTC Calculation: 398 R Axis:   7  Text Interpretation: Normal sinus rhythm Normal ECG Confirmed by Debbe Odea ([id number]) on 10/24/2023 10:03:16 AM    Recent Labs: 03/14/2023: Hemoglobin 12.4; Platelets 306 09/19/2023: ALT 8; BUN 14; Creatinine, Ser 0.81; Magnesium 2.0; Potassium 3.8; Sodium 142 10/09/2023: TSH 2.870  Recent Lipid Panel    Component Value Date/Time   CHOL 161 03/14/2023 1107   TRIG 34 03/14/2023 1107   HDL 67 03/14/2023 1107   CHOLHDL 2.4 03/14/2023 1107   LDLCALC 86 03/14/2023 1107      Risk Assessment/Calculations:             Physical Exam:    VS:  BP 106/78 (BP Location: Right Arm, Patient Position: Sitting, Cuff Size: Large)   Pulse 64   Ht 4\' 9"  (1.448 m)   Wt 178 lb 12.8 oz (81.1 kg)   SpO2 99%   BMI 38.69 kg/m     Wt Readings from Last 3 Encounters:  10/24/23 178 lb 12.8 oz (81.1 kg)  10/09/23 180 lb (81.6 kg)  09/17/23 178 lb (80.7 kg)     GEN:  Well nourished, well developed in no acute distress HEENT: Normal NECK: No JVD; No carotid bruits CARDIAC: RRR, no murmurs, rubs, gallops RESPIRATORY:  Clear to auscultation without rales, wheezing or rhonchi  ABDOMEN: Soft, non-tender, non-distended MUSCULOSKELETAL:  No edema; No deformity  SKIN: Warm and dry NEUROLOGIC:  Alert and oriented x 3 PSYCHIATRIC:  Normal affect   ASSESSMENT:    1. Precordial pain    PLAN:    In order of problems listed above:  Chest pain, symptoms may be stress-induced.  Evaluate cardiac etiology with echo and coronary CT.  If cardiac testing is normal, patient may be reassured.     Medication Adjustments/Labs and Tests Ordered: Current medicines are reviewed at length with the patient today.  Concerns regarding medicines are outlined above.  Orders Placed This Encounter  Procedures   CT CORONARY MORPH W/CTA COR W/SCORE W/CA W/CM &/OR WO/CM   Basic Metabolic Panel (BMET)   EKG 12-Lead   ECHOCARDIOGRAM COMPLETE   Meds ordered this encounter  Medications   metoprolol tartrate (LOPRESSOR) 100 MG tablet    Sig: Take 1 tablet (100 mg total) by mouth once for 1 dose. TWO HOURS PRIOR TO CARDIAC CTA    Dispense:  1 tablet    Refill:  0    Patient Instructions  Medication Instructions:   Your physician recommends that you continue on your current medications as directed. Please refer to the Current Medication list given to you today.  *If you need a refill on your cardiac medications before your next appointment, please call your pharmacy*   Lab  Work:  Your physician recommends you have labs - BMP   If you have labs (blood work) drawn today and your tests are completely normal, you will receive your results only by: MyChart Message (if you have MyChart) OR A paper copy in the mail If you have any lab test that is abnormal or we need to change your treatment, we will call you to review the results.   Testing/Procedures:  Your physician has requested that you have an echocardiogram. Echocardiography is a painless test that uses sound waves to create images of your heart. It provides your doctor with information about the size  and shape of your heart and how well your heart's chambers and valves are working. This procedure takes approximately one hour. There are no restrictions for this procedure. Please do NOT wear cologne, perfume, aftershave, or lotions (deodorant is allowed). Please arrive 15 minutes prior to your appointment time.  Please note: We ask at that you not bring children with you during ultrasound (echo/ vascular) testing. Due to room size and safety concerns, children are not allowed in the ultrasound rooms during exams. Our front office staff cannot provide observation of children in our lobby area while testing is being conducted. An adult accompanying a patient to their appointment will only be allowed in the ultrasound room at the discretion of the ultrasound technician under special circumstances. We apologize for any inconvenience.    Your cardiac CT will be scheduled at one of the below locations:   [facility] [address] [place], [place] [address] [phone]  OR   [facility] [address] [place], [place] [address] [phone]  If scheduled at [facility] or [facility], please arrive 15 mins early for check-in and test prep.  There is spacious parking and easy access to the  radiology department from the [facility] entrance. Please enter here and check-in with the desk attendant.   Please follow these instructions carefully (unless otherwise directed):  An IV will be required for this test and Nitroglycerin will be given.  Hold all erectile dysfunction medications at least 3 days (72 hrs) prior to test. (Ie viagra, cialis, sildenafil, tadalafil, etc)   On the Night Before the Test: Be sure to Drink plenty of water. Do not consume any caffeinated/decaffeinated beverages or chocolate 12 hours prior to your test. Do not take any antihistamines 12 hours prior to your test.  On the Day of the Test: Drink plenty of water until 1 hour prior to the test. Do not eat any food 1 hour prior to test. You may take your regular medications prior to the test.  Take metoprolol (Lopressor) two hours prior to test. If you take Furosemide/Hydrochlorothiazide/Spironolactone, please HOLD on the morning of the test. FEMALES- please wear underwire-free bra if available, avoid dresses & tight clothing      After the Test: Drink plenty of water. After receiving IV contrast, you may experience a mild flushed feeling. This is normal. On occasion, you may experience a mild rash up to 24 hours after the test. This is not dangerous. If this occurs, you can take Benadryl 25 mg and increase your fluid intake. If you experience trouble breathing, this can be serious. If it is severe call 911 IMMEDIATELY. If it is mild, please call our office. If you take any of these medications: Glipizide/Metformin, Avandament, Glucavance, please do not take 48 hours after completing test unless otherwise instructed.  We will call to schedule your test 2-4 weeks out understanding that some insurance companies will need an authorization prior to the service being performed.   For more information and frequently asked questions, please visit our website : [email or web address]  For  non-scheduling related questions, please contact the cardiac imaging nurse navigator should you have any questions/concerns: Cardiac Imaging Nurse Navigators Direct Office Dial: [phone]   For scheduling needs, including cancellations and rescheduling, please call [place], [phone].   Follow-Up: At [facility], you and your health needs are our priority.  As part of our continuing mission to provide you  with exceptional heart care, we have created designated Provider Care Teams.  These Care Teams include your primary Cardiologist (physician) and Advanced Practice Providers (APPs -  Physician Assistants and Nurse Practitioners) who all work together to provide you with the care you need, when you need it.  We recommend signing up for the patient portal called "MyChart".  Sign up information is provided on this After Visit Summary.  MyChart is used to connect with patients for Virtual Visits (Telemedicine).  Patients are able to view lab/test results, encounter notes, upcoming appointments, etc.  Non-urgent messages can be sent to your provider as well.   To learn more about what you can do with MyChart, go to [email or web address].    Your next appointment:    8 - 10 weeks  Provider:   You may see Debbe Odea, MD or one of the following Advanced Practice Providers on your designated Care Team:   Nicolasa Ducking, NP Eula Listen, PA-C Cadence Fransico Michael, PA-C Charlsie Quest, NP Carlos Levering, NP    Signed, Debbe Odea, MD  10/24/2023 10:34 AM    [facility]

## 2023-10-24 NOTE — Patient Instructions (Signed)
Medication Instructions:   Your physician recommends that you continue on your current medications as directed. Please refer to the Current Medication list given to you today.  *If you need a refill on your cardiac medications before your next appointment, please call your pharmacy*   Lab Work:  Your physician recommends you have labs - BMP   If you have labs (blood work) drawn today and your tests are completely normal, you will receive your results only by: MyChart Message (if you have MyChart) OR A paper copy in the mail If you have any lab test that is abnormal or we need to change your treatment, we will call you to review the results.   Testing/Procedures:  Your physician has requested that you have an echocardiogram. Echocardiography is a painless test that uses sound waves to create images of your heart. It provides your doctor with information about the size and shape of your heart and how well your heart's chambers and valves are working. This procedure takes approximately one hour. There are no restrictions for this procedure. Please do NOT wear cologne, perfume, aftershave, or lotions (deodorant is allowed). Please arrive 15 minutes prior to your appointment time.  Please note: We ask at that you not bring children with you during ultrasound (echo/ vascular) testing. Due to room size and safety concerns, children are not allowed in the ultrasound rooms during exams. Our front office staff cannot provide observation of children in our lobby area while testing is being conducted. An adult accompanying a patient to their appointment will only be allowed in the ultrasound room at the discretion of the ultrasound technician under special circumstances. We apologize for any inconvenience.    Your cardiac CT will be scheduled at one of the below locations:   [facility] [address] [place], [place] [address] [phone]  OR    [facility] [address] [place], [place] [address] [phone]  If scheduled at [facility] or [facility], please arrive 15 mins early for check-in and test prep.  There is spacious parking and easy access to the radiology department from the [facility] Heart and Vascular entrance. Please enter here and check-in with the desk attendant.   Please follow these instructions carefully (unless otherwise directed):  An IV will be required for this test and Nitroglycerin will be given.  Hold all erectile dysfunction medications at least 3 days (72 hrs) prior to test. (Ie viagra, cialis, sildenafil, tadalafil, etc)   On the Night Before the Test: Be sure to Drink plenty of water. Do not consume any caffeinated/decaffeinated beverages or chocolate 12 hours prior to your test. Do not take any antihistamines 12 hours prior to your test.  On the Day of the Test: Drink plenty of water until 1 hour prior to the test. Do not eat any food 1 hour prior to test. You may take your regular medications prior to the test.  Take metoprolol (Lopressor) two hours prior to test. If you take Furosemide/Hydrochlorothiazide/Spironolactone, please HOLD on the morning of the test. FEMALES- please wear underwire-free bra if available, avoid dresses & tight clothing      After the Test: Drink plenty of water. After receiving IV contrast, you may experience a mild flushed feeling. This is normal. On occasion, you may experience a mild rash up to 24 hours after the test. This is not dangerous. If this occurs, you can take Benadryl 25 mg and  increase your fluid intake. If you experience trouble breathing, this can be serious. If it is severe call 911 IMMEDIATELY. If it is mild, please call our office. If you take any of these medications: Glipizide/Metformin, Avandament, Glucavance, please do not take 48 hours after completing test unless  otherwise instructed.  We will call to schedule your test 2-4 weeks out understanding that some insurance companies will need an authorization prior to the service being performed.   For more information and frequently asked questions, please visit our website : [email or web address]  For non-scheduling related questions, please contact the cardiac imaging nurse navigator should you have any questions/concerns: Cardiac Imaging Nurse Navigators Direct Office Dial: [phone]   For scheduling needs, including cancellations and rescheduling, please call [place], [phone].   Follow-Up: At [facility], you and your health needs are our priority.  As part of our continuing mission to provide you with exceptional heart care, we have created designated Provider Care Teams.  These Care Teams include your primary Cardiologist (physician) and Advanced Practice Providers (APPs -  Physician Assistants and Nurse Practitioners) who all work together to provide you with the care you need, when you need it.  We recommend signing up for the patient portal called "MyChart".  Sign up information is provided on this After Visit Summary.  MyChart is used to connect with patients for Virtual Visits (Telemedicine).  Patients are able to view lab/test results, encounter notes, upcoming appointments, etc.  Non-urgent messages can be sent to your provider as well.   To learn more about what you can do with MyChart, go to [email or web address].    Your next appointment:    8 - 10 weeks  Provider:   You may see Debbe Odea, MD or one of the following Advanced Practice Providers on your designated Care Team:   Nicolasa Ducking, NP Eula Listen, PA-C Cadence Fransico Michael, PA-C Charlsie Quest, NP Carlos Levering, NP

## 2023-10-25 LAB — BASIC METABOLIC PANEL
BUN/Creatinine Ratio: 18 (ref 9–23)
BUN: 13 mg/dL (ref 6–20)
CO2: 22 mmol/L (ref 20–29)
Calcium: 8.8 mg/dL (ref 8.7–10.2)
Chloride: 105 mmol/L (ref 96–106)
Creatinine, Ser: 0.73 mg/dL (ref 0.57–1.00)
Glucose: 70 mg/dL (ref 70–99)
Potassium: 4 mmol/L (ref 3.5–5.2)
Sodium: 140 mmol/L (ref 134–144)
eGFR: 109 mL/min/{1.73_m2} (ref >59)

## 2023-10-29 ENCOUNTER — Encounter (HOSPITAL_COMMUNITY): Payer: Self-pay

## 2023-10-30 ENCOUNTER — Telehealth (HOSPITAL_COMMUNITY): Payer: Self-pay | Admitting: *Deleted

## 2023-10-30 NOTE — Telephone Encounter (Signed)
Reaching out to patient to offer assistance regarding upcoming cardiac imaging study; pt verbalizes understanding of appt date/time, parking situation and where to check in, pre-test NPO status and medications ordered, and verified current allergies; name and call back number provided for further questions should they arise Hayley Sharpe RN Navigator Cardiac Imaging [facility] [phone] office [phone] cell  

## 2023-10-31 ENCOUNTER — Ambulatory Visit
Admission: RE | Admit: 2023-10-31 | Discharge: 2023-10-31 | Disposition: A | Discharge status: Home / Self Care | Payer: BC Managed Care – PPO | Source: Ambulatory Visit | Attending: Cardiology | Admitting: Cardiology

## 2023-10-31 DIAGNOSIS — R072 Precordial pain: Secondary | ICD-10-CM | POA: Diagnosis not present

## 2023-10-31 MED ORDER — NITROGLYCERIN 0.4 MG SL SUBL
0.8000 mg | SUBLINGUAL_TABLET | Freq: Once | SUBLINGUAL | Status: AC
  Administered 2023-10-31: 0.8 mg via SUBLINGUAL

## 2023-10-31 MED ORDER — DILTIAZEM HCL 25 MG/5ML IV SOLN: 10.0000 mg | INTRAVENOUS | Status: DC | PRN

## 2023-10-31 MED ORDER — SODIUM CHLORIDE 0.9 % IV SOLN: INTRAVENOUS | Status: DC

## 2023-10-31 MED ORDER — IOHEXOL 350 MG/ML SOLN
100.0000 mL | Freq: Once | INTRAVENOUS | Status: AC | PRN
  Administered 2023-10-31: 100 mL via INTRAVENOUS

## 2023-10-31 MED ORDER — METOPROLOL TARTRATE 5 MG/5ML IV SOLN
10.0000 mg | INTRAVENOUS | Status: DC | PRN
  Administered 2023-10-31: 5 mg via INTRAVENOUS

## 2023-10-31 NOTE — Progress Notes (Signed)
Patient tolerated CT well. Drank water after. Vital signs stable encourage to drink water throughout day.Reasons explained and verbalized understanding. Ambulated steady gait.  

## 2023-11-14 ENCOUNTER — Ambulatory Visit: Payer: BC Managed Care – PPO | Attending: Cardiology

## 2023-11-14 DIAGNOSIS — R072 Precordial pain: Secondary | ICD-10-CM | POA: Diagnosis not present

## 2023-11-14 LAB — ECHOCARDIOGRAM COMPLETE
Area-P 1/2: 3.65 cm2
S' Lateral: 3 cm

## 2023-12-09 ENCOUNTER — Ambulatory Visit: Payer: BC Managed Care – PPO | Admitting: Family Medicine

## 2023-12-30 ENCOUNTER — Ambulatory Visit: Payer: BC Managed Care – PPO | Attending: Cardiology | Admitting: Cardiology

## 2023-12-30 ENCOUNTER — Encounter: Payer: Self-pay | Admitting: Cardiology

## 2023-12-30 VITALS — BP 120/70 | HR 67 | Ht <= 58 in | Wt 181.4 lb

## 2023-12-30 DIAGNOSIS — R072 Precordial pain: Secondary | ICD-10-CM

## 2023-12-30 NOTE — Patient Instructions (Signed)
Medication Instructions:   Your physician recommends that you continue on your current medications as directed. Please refer to the Current Medication list given to you today.  *If you need a refill on your cardiac medications before your next appointment, please call your pharmacy*   Lab Work:  None Ordered  If you have labs (blood work) drawn today and your tests are completely normal, you will receive your results only by: MyChart Message (if you have MyChart) OR A paper copy in the mail If you have any lab test that is abnormal or we need to change your treatment, we will call you to review the results.   Testing/Procedures:  None Ordered   Follow-Up: At [facility], you and your health needs are our priority.  As part of our continuing mission to provide you with exceptional heart care, we have created designated Provider Care Teams.  These Care Teams include your primary Cardiologist (physician) and Advanced Practice Providers (APPs -  Physician Assistants and Nurse Practitioners) who all work together to provide you with the care you need, when you need it.  We recommend signing up for the patient portal called "MyChart".  Sign up information is provided on this After Visit Summary.  MyChart is used to connect with patients for Virtual Visits (Telemedicine).  Patients are able to view lab/test results, encounter notes, upcoming appointments, etc.  Non-urgent messages can be sent to your provider as well.   To learn more about what you can do with MyChart, go to [email or web address].    Your next appointment:    As needed 

## 2023-12-30 NOTE — Progress Notes (Signed)
Cardiology Office Note:    Date:  12/30/2023   ID:  Brenda Dodson, DOB 1986-08-06, MRN [id number]  PCP:  Ronnald Ramp, MD   [facility] Providers Cardiologist:  Debbe Odea, MD     Referring MD: Brett Albino*   Chief Complaint  Patient presents with   Follow-up    Discuss cardiac testing results.  Patient denies new or acute cardiac problems/concerns today.      History of Present Illness:    Brenda Dodson is a 38 y.o. female with no significant past medical history who presents for follow-up.  She was previously seen with symptoms of chest pain.    Workup with echo and coronary CTA obtained to evaluate cardiac etiology.  She states feeling well, denies any further episodes of chest pain.  Has no concerns today.  Presents for cardiac testing results.   Past Medical History:  Diagnosis Date   ASCUS with positive high risk HPV cervical    Chlamydia 2017   Pneumonia     Past Surgical History:  Procedure Laterality Date   APPENDECTOMY     COLPOSCOPY     TUBAL LIGATION     XI ROBOTIC ASSISTED SALPINGECTOMY Bilateral 01/16/2022   Procedure: XI ROBOTIC ASSISTED SALPINGECTOMY;  Surgeon: Natale Milch, MD;  Location: [facility] ORS;  Service: Gynecology;  Laterality: Bilateral;    Current Medications: No outpatient medications have been marked as taking for the 12/30/23 encounter (Office Visit) with Debbe Odea, MD.     Allergies:   Patient has no known allergies.   Social History   Socioeconomic History   Marital status: Married    Spouse name: Brenda Dodson   Number of children: Not on file   Years of education: Not on file   Highest education level: Not on file  Occupational History   Not on file  Tobacco Use   Smoking status: Never   Smokeless tobacco: Never  Vaping Use   Vaping status: Never Used  Substance and Sexual Activity   Alcohol use: Yes    Alcohol/week: 12.0 standard drinks of alcohol    Types: 8  Glasses of wine, 4 Cans of beer per week   Drug use: Not Currently   Sexual activity: Yes    Birth control/protection: None  Other Topics Concern   Not on file  Social History Narrative   ** Merged History Encounter **       Social Drivers of Corporate investment banker Strain: Not on file  Food Insecurity: Not on file  Transportation Needs: Not on file  Physical Activity: Not on file  Stress: Not on file  Social Connections: Not on file     Family History: The patient's family history includes Alcohol abuse in her father; Cancer in her father, maternal grandfather, and paternal aunt; Diabetes in her mother; Hypertension in her father; Prostate cancer in her father; Seizures in her father; Stroke in her father. There is no history of Breast cancer, Ovarian cancer, or Colon cancer.  ROS:   Please see the history of present illness.     All other systems reviewed and are negative.  EKGs/Labs/Other Studies Reviewed:    The following studies were reviewed today:       Recent Labs: 03/14/2023: Hemoglobin 12.4; Platelets 306 09/19/2023: ALT 8; Magnesium 2.0 10/09/2023: TSH 2.870 10/24/2023: BUN 13; Creatinine, Ser 0.73; Potassium 4.0; Sodium 140  Recent Lipid Panel    Component Value Date/Time   CHOL 161 03/14/2023 1107   TRIG  34 03/14/2023 1107   HDL 67 03/14/2023 1107   CHOLHDL 2.4 03/14/2023 1107   LDLCALC 86 03/14/2023 1107     Risk Assessment/Calculations:             Physical Exam:    VS:  BP 120/70 (BP Location: Left Arm, Patient Position: Sitting, Cuff Size: Large)   Pulse 67   Ht 4' 9.5" (1.461 m)   Wt 181 lb 6.4 oz (82.3 kg)   SpO2 99%   BMI 38.57 kg/m     Wt Readings from Last 3 Encounters:  12/30/23 181 lb 6.4 oz (82.3 kg)  10/24/23 178 lb 12.8 oz (81.1 kg)  10/09/23 180 lb (81.6 kg)     GEN:  Well nourished, well developed in no acute distress HEENT: Normal NECK: No JVD; No carotid bruits CARDIAC: RRR, no murmurs, rubs,  gallops RESPIRATORY:  Clear to auscultation without rales, wheezing or rhonchi  ABDOMEN: Soft, non-tender, non-distended MUSCULOSKELETAL:  No edema; No deformity  SKIN: Warm and dry NEUROLOGIC:  Alert and oriented x 3 PSYCHIATRIC:  Normal affect   ASSESSMENT:    1. Precordial pain    PLAN:    In order of problems listed above:  Chest pain, echo shows normal systolic and diastolic function, EF 55 to 60%.  Coronary CTA 10/2023 no CAD.  Patient made aware of results, reassured.  Symptoms may be stress-induced.   Follow-up as needed.     Medication Adjustments/Labs and Tests Ordered: Current medicines are reviewed at length with the patient today.  Concerns regarding medicines are outlined above.  No orders of the defined types were placed in this encounter.  No orders of the defined types were placed in this encounter.   Patient Instructions  Medication Instructions:   Your physician recommends that you continue on your current medications as directed. Please refer to the Current Medication list given to you today.  *If you need a refill on your cardiac medications before your next appointment, please call your pharmacy*   Lab Work:  None Ordered  If you have labs (blood work) drawn today and your tests are completely normal, you will receive your results only by: MyChart Message (if you have MyChart) OR A paper copy in the mail If you have any lab test that is abnormal or we need to change your treatment, we will call you to review the results.   Testing/Procedures:  None Ordered   Follow-Up: At [facility], you and your health needs are our priority.  As part of our continuing mission to provide you with exceptional heart care, we have created designated Provider Care Teams.  These Care Teams include your primary Cardiologist (physician) and Advanced Practice Providers (APPs -  Physician Assistants and Nurse Practitioners) who all work together to provide  you with the care you need, when you need it.  We recommend signing up for the patient portal called "MyChart".  Sign up information is provided on this After Visit Summary.  MyChart is used to connect with patients for Virtual Visits (Telemedicine).  Patients are able to view lab/test results, encounter notes, upcoming appointments, etc.  Non-urgent messages can be sent to your provider as well.   To learn more about what you can do with MyChart, go to [email or web address].    Your next appointment:    As needed   Signed, Debbe Odea, MD  12/30/2023 2:03 PM    [facility]
